# Patient Record
Sex: Female | Born: 1970 | Hispanic: No | Marital: Single | Smoking: Current every day smoker
Health system: Southern US, Community
[De-identification: ages and names within clinical notes are randomized; demographics above are authoritative.]

## PROBLEM LIST (undated history)

## (undated) DIAGNOSIS — R569 Unspecified convulsions: Secondary | ICD-10-CM

## (undated) HISTORY — PX: OTHER SURGICAL HISTORY: SHX169

---

## 2014-06-08 ENCOUNTER — Emergency Department (HOSPITAL_COMMUNITY): Payer: Medicaid - Out of State

## 2014-06-08 ENCOUNTER — Encounter (HOSPITAL_COMMUNITY): Payer: Self-pay | Admitting: General Practice

## 2014-06-08 ENCOUNTER — Inpatient Hospital Stay (HOSPITAL_COMMUNITY)
Admission: EM | Admit: 2014-06-08 | Discharge: 2014-06-15 | DRG: 100 | Disposition: A | Discharge status: Home / Self Care | Payer: Medicaid - Out of State | Attending: Internal Medicine | Admitting: Internal Medicine

## 2014-06-08 DIAGNOSIS — E876 Hypokalemia: Secondary | ICD-10-CM | POA: Insufficient documentation

## 2014-06-08 DIAGNOSIS — E872 Acidosis: Secondary | ICD-10-CM | POA: Diagnosis present

## 2014-06-08 DIAGNOSIS — B373 Candidiasis of vulva and vagina: Secondary | ICD-10-CM | POA: Diagnosis present

## 2014-06-08 DIAGNOSIS — J690 Pneumonitis due to inhalation of food and vomit: Secondary | ICD-10-CM | POA: Diagnosis not present

## 2014-06-08 DIAGNOSIS — E871 Hypo-osmolality and hyponatremia: Secondary | ICD-10-CM | POA: Diagnosis not present

## 2014-06-08 DIAGNOSIS — G809 Cerebral palsy, unspecified: Secondary | ICD-10-CM | POA: Diagnosis present

## 2014-06-08 DIAGNOSIS — J9601 Acute respiratory failure with hypoxia: Secondary | ICD-10-CM | POA: Diagnosis not present

## 2014-06-08 DIAGNOSIS — G40901 Epilepsy, unspecified, not intractable, with status epilepticus: Secondary | ICD-10-CM

## 2014-06-08 DIAGNOSIS — R4189 Other symptoms and signs involving cognitive functions and awareness: Secondary | ICD-10-CM | POA: Diagnosis present

## 2014-06-08 DIAGNOSIS — D72829 Elevated white blood cell count, unspecified: Secondary | ICD-10-CM

## 2014-06-08 DIAGNOSIS — G8194 Hemiplegia, unspecified affecting left nondominant side: Secondary | ICD-10-CM

## 2014-06-08 DIAGNOSIS — A419 Sepsis, unspecified organism: Secondary | ICD-10-CM | POA: Diagnosis not present

## 2014-06-08 DIAGNOSIS — G40309 Generalized idiopathic epilepsy and epileptic syndromes, not intractable, without status epilepticus: Secondary | ICD-10-CM | POA: Diagnosis present

## 2014-06-08 DIAGNOSIS — R410 Disorientation, unspecified: Secondary | ICD-10-CM

## 2014-06-08 DIAGNOSIS — G40311 Generalized idiopathic epilepsy and epileptic syndromes, intractable, with status epilepticus: Secondary | ICD-10-CM | POA: Diagnosis not present

## 2014-06-08 DIAGNOSIS — G8190 Hemiplegia, unspecified affecting unspecified side: Secondary | ICD-10-CM

## 2014-06-08 DIAGNOSIS — G40301 Generalized idiopathic epilepsy and epileptic syndromes, not intractable, with status epilepticus: Secondary | ICD-10-CM | POA: Diagnosis not present

## 2014-06-08 DIAGNOSIS — G40911 Epilepsy, unspecified, intractable, with status epilepticus: Principal | ICD-10-CM | POA: Diagnosis present

## 2014-06-08 DIAGNOSIS — Z79899 Other long term (current) drug therapy: Secondary | ICD-10-CM | POA: Diagnosis not present

## 2014-06-08 DIAGNOSIS — J189 Pneumonia, unspecified organism: Secondary | ICD-10-CM

## 2014-06-08 DIAGNOSIS — B3731 Acute candidiasis of vulva and vagina: Secondary | ICD-10-CM | POA: Insufficient documentation

## 2014-06-08 DIAGNOSIS — R64 Cachexia: Secondary | ICD-10-CM | POA: Diagnosis present

## 2014-06-08 DIAGNOSIS — T17908A Unspecified foreign body in respiratory tract, part unspecified causing other injury, initial encounter: Secondary | ICD-10-CM

## 2014-06-08 DIAGNOSIS — G9340 Encephalopathy, unspecified: Secondary | ICD-10-CM | POA: Insufficient documentation

## 2014-06-08 DIAGNOSIS — G40909 Epilepsy, unspecified, not intractable, without status epilepticus: Secondary | ICD-10-CM | POA: Insufficient documentation

## 2014-06-08 HISTORY — DX: Unspecified convulsions: R56.9

## 2014-06-08 LAB — COMPREHENSIVE METABOLIC PANEL
ALT: 19 U/L (ref 0–35)
AST: 31 U/L (ref 0–37)
Albumin: 4.2 g/dL (ref 3.5–5.2)
Alkaline Phosphatase: 83 U/L (ref 39–117)
Anion gap: 12 (ref 5–15)
BUN: 23 mg/dL (ref 6–23)
CALCIUM: 9.7 mg/dL (ref 8.4–10.5)
CHLORIDE: 106 mmol/L (ref 96–112)
CO2: 19 mmol/L (ref 19–32)
Creatinine, Ser: 1.01 mg/dL (ref 0.50–1.10)
GFR, EST AFRICAN AMERICAN: 78 mL/min — AB (ref >90)
GFR, EST NON AFRICAN AMERICAN: 68 mL/min — AB (ref >90)
Glucose, Bld: 101 mg/dL — ABNORMAL HIGH (ref 70–99)
Potassium: 4 mmol/L (ref 3.5–5.1)
Sodium: 137 mmol/L (ref 135–145)
Total Bilirubin: 1.2 mg/dL (ref 0.3–1.2)
Total Protein: 7.9 g/dL (ref 6.0–8.3)

## 2014-06-08 LAB — URINALYSIS, ROUTINE W REFLEX MICROSCOPIC
Bilirubin Urine: NEGATIVE
Glucose, UA: NEGATIVE mg/dL
Hgb urine dipstick: NEGATIVE
Ketones, ur: 40 mg/dL — AB
Leukocytes, UA: NEGATIVE
Nitrite: NEGATIVE
Protein, ur: NEGATIVE mg/dL
SPECIFIC GRAVITY, URINE: 1.027 (ref 1.005–1.030)
UROBILINOGEN UA: 0.2 mg/dL (ref 0.0–1.0)
pH: 5.5 (ref 5.0–8.0)

## 2014-06-08 LAB — CBC WITH DIFFERENTIAL/PLATELET
Basophils Absolute: 0 10*3/uL (ref 0.0–0.1)
Basophils Relative: 0 % (ref 0–1)
EOS ABS: 0 10*3/uL (ref 0.0–0.7)
EOS PCT: 0 % (ref 0–5)
HCT: 39 % (ref 36.0–46.0)
Hemoglobin: 13.1 g/dL (ref 12.0–15.0)
LYMPHS ABS: 1.3 10*3/uL (ref 0.7–4.0)
Lymphocytes Relative: 6 % — ABNORMAL LOW (ref 12–46)
MCH: 30 pg (ref 26.0–34.0)
MCHC: 33.6 g/dL (ref 30.0–36.0)
MCV: 89.2 fL (ref 78.0–100.0)
Monocytes Absolute: 0.7 10*3/uL (ref 0.1–1.0)
Monocytes Relative: 3 % (ref 3–12)
NEUTROS PCT: 91 % — AB (ref 43–77)
Neutro Abs: 20 10*3/uL — ABNORMAL HIGH (ref 1.7–7.7)
PLATELETS: 280 10*3/uL (ref 150–400)
RBC: 4.37 MIL/uL (ref 3.87–5.11)
RDW: 14.1 % (ref 11.5–15.5)
WBC: 22.1 10*3/uL — ABNORMAL HIGH (ref 4.0–10.5)

## 2014-06-08 LAB — I-STAT BETA HCG BLOOD, ED (MC, WL, AP ONLY): I-stat hCG, quantitative: <5 m[IU]/mL (ref <5)

## 2014-06-08 LAB — CBG MONITORING, ED: GLUCOSE-CAPILLARY: 91 mg/dL (ref 70–99)

## 2014-06-08 LAB — MRSA PCR SCREENING: MRSA by PCR: NEGATIVE

## 2014-06-08 LAB — I-STAT CG4 LACTIC ACID, ED: LACTIC ACID, VENOUS: 2.83 mmol/L — AB (ref 0.5–2.0)

## 2014-06-08 LAB — MAGNESIUM: Magnesium: 1.8 mg/dL (ref 1.5–2.5)

## 2014-06-08 LAB — GLUCOSE, CAPILLARY: Glucose-Capillary: 93 mg/dL (ref 70–99)

## 2014-06-08 MED ORDER — VALPROATE SODIUM 500 MG/5ML IV SOLN
500.0000 mg | Freq: Two times a day (BID) | INTRAVENOUS | Status: DC
  Administered 2014-06-08: 500 mg via INTRAVENOUS
  Filled 2014-06-08 (×2): qty 5

## 2014-06-08 MED ORDER — LEVETIRACETAM IN NACL 1000 MG/100ML IV SOLN
1000.0000 mg | INTRAVENOUS | Status: AC
  Administered 2014-06-08: 1000 mg via INTRAVENOUS
  Filled 2014-06-08: qty 100

## 2014-06-08 MED ORDER — SODIUM CHLORIDE 0.9 % IV BOLUS (SEPSIS)
1000.0000 mL | Freq: Once | INTRAVENOUS | Status: AC
  Administered 2014-06-08: 1000 mL via INTRAVENOUS

## 2014-06-08 MED ORDER — LORAZEPAM 2 MG/ML IJ SOLN
1.0000 mg | Freq: Once | INTRAMUSCULAR | Status: AC
  Administered 2014-06-08: 1 mg via INTRAVENOUS
  Filled 2014-06-08: qty 1

## 2014-06-08 MED ORDER — SODIUM CHLORIDE 0.9 % IV SOLN
200.0000 mg | INTRAVENOUS | Status: AC
  Administered 2014-06-08: 200 mg via INTRAVENOUS
  Filled 2014-06-08: qty 20

## 2014-06-08 MED ORDER — VALPROATE SODIUM 500 MG/5ML IV SOLN
1.0000 g | Freq: Once | INTRAVENOUS | Status: DC
  Filled 2014-06-08: qty 10

## 2014-06-08 MED ORDER — ALBUTEROL SULFATE (2.5 MG/3ML) 0.083% IN NEBU: 2.5000 mg | INHALATION_SOLUTION | RESPIRATORY_TRACT | Status: DC | PRN

## 2014-06-08 MED ORDER — ONDANSETRON HCL 4 MG/2ML IJ SOLN: 4.0000 mg | Freq: Four times a day (QID) | INTRAMUSCULAR | Status: DC | PRN

## 2014-06-08 MED ORDER — ENOXAPARIN SODIUM 40 MG/0.4ML ~~LOC~~ SOLN: 40.0000 mg | SUBCUTANEOUS | Status: DC

## 2014-06-08 MED ORDER — SODIUM CHLORIDE 0.9 % IV SOLN
INTRAVENOUS | Status: DC
  Administered 2014-06-08 – 2014-06-09 (×2): via INTRAVENOUS

## 2014-06-08 MED ORDER — LORAZEPAM 2 MG/ML IJ SOLN
1.0000 mg | INTRAMUSCULAR | Status: DC | PRN
Start: 2014-06-08 — End: 2014-06-15
  Administered 2014-06-08 – 2014-06-09 (×3): 1 mg via INTRAVENOUS
  Filled 2014-06-08 (×3): qty 1

## 2014-06-08 MED ORDER — SODIUM CHLORIDE 0.9 % IV SOLN
200.0000 mg | Freq: Two times a day (BID) | INTRAVENOUS | Status: DC
  Administered 2014-06-09 (×2): 200 mg via INTRAVENOUS
  Filled 2014-06-08 (×4): qty 20

## 2014-06-08 MED ORDER — ENOXAPARIN SODIUM 30 MG/0.3ML ~~LOC~~ SOLN
30.0000 mg | SUBCUTANEOUS | Status: DC
  Administered 2014-06-08 – 2014-06-14 (×7): 30 mg via SUBCUTANEOUS
  Filled 2014-06-08 (×7): qty 0.3

## 2014-06-08 MED ORDER — VALPROATE SODIUM 500 MG/5ML IV SOLN
500.0000 mg | Freq: Three times a day (TID) | INTRAVENOUS | Status: DC
  Administered 2014-06-08 – 2014-06-10 (×5): 500 mg via INTRAVENOUS
  Filled 2014-06-08 (×7): qty 5

## 2014-06-08 MED ORDER — SODIUM CHLORIDE 0.9 % IJ SOLN
3.0000 mL | Freq: Two times a day (BID) | INTRAMUSCULAR | Status: DC
  Administered 2014-06-08 – 2014-06-14 (×5): 3 mL via INTRAVENOUS

## 2014-06-08 MED ORDER — LEVETIRACETAM IN NACL 1500 MG/100ML IV SOLN
1500.0000 mg | Freq: Two times a day (BID) | INTRAVENOUS | Status: DC
  Administered 2014-06-08 – 2014-06-13 (×11): 1500 mg via INTRAVENOUS
  Filled 2014-06-08 (×15): qty 100

## 2014-06-08 MED ORDER — DEXTROSE 5 % IV SOLN
500.0000 mg | Freq: Two times a day (BID) | INTRAVENOUS | Status: DC
  Filled 2014-06-08: qty 5

## 2014-06-08 MED ORDER — ACETAMINOPHEN 650 MG RE SUPP
650.0000 mg | Freq: Four times a day (QID) | RECTAL | Status: DC | PRN
Start: 2014-06-08 — End: 2014-06-15
  Administered 2014-06-10 – 2014-06-11 (×4): 650 mg via RECTAL
  Filled 2014-06-08 (×4): qty 1

## 2014-06-08 NOTE — ED Notes (Signed)
Neurology at bedside and aware of recent seizure

## 2014-06-08 NOTE — ED Notes (Signed)
Pt.s familuy came out an reported that pt. Having a seizure X 1

## 2014-06-08 NOTE — Consult Note (Signed)
Neurology Consultation Reason for Consult: Seizures Referring Physician: Silverio LayYao, D  CC: Seizures  History is obtained from:Family with interoreter.  HPI: Rachel Bush is a 44 y.o. female with a history of seizures since birth as well as cerebral dysfunction which at this time is of unclear etiology to me. Family reports that she was able to talk in the past, but has not been able to in 12 - 13 years. She is non-ambulatory at baseline. Family reports that as a young child, she was able to walk.  She was phenobarbital in the past, but was seeing a neurologist in [place] and is currently on depakote and keppra. She typically will have approximately one seizure per day, but over the past 24 hours, she has been having frequent recurrent seizures.   Depakote level in the ER was 0   ROS:  Unable to obtain due to altered mental status.   Past Medical History  Diagnosis Date  . Seizures     Family History: No history of similar afflictions in family members  Social History: Lives with sister.   Exam: Current vital signs: BP 115/72 mmHg  Pulse 91  Temp(Src) 99.8 F (37.7 C) (Rectal)  Resp 17  Ht 4\' 11"  (1.499 m)  Wt 52.164 kg (115 lb)  BMI 23.21 kg/m2  SpO2 100%  LMP  (LMP Unknown) Vital signs in last 24 hours: Temp:  [99.8 F (37.7 C)] 99.8 F (37.7 C) (03/01 0949) Pulse Rate:  [82-138] 91 (03/01 1230) Resp:  [16-27] 17 (03/01 1230) BP: (107-115)/(67-78) 115/72 mmHg (03/01 1230) SpO2:  [97 %-100 %] 100 % (03/01 1230) Weight:  [52.164 kg (115 lb)] 52.164 kg (115 lb) (03/01 0949)   Physical Exam  Constitutional: Appears thin Psych: does not speak Eyes: No scleral injection HENT: No OP obstrucion Head: Normocephalic.  Cardiovascular: Normal rate and regular rhythm.  Respiratory: Effort normal  GI: Soft.  No distension. There is no tenderness.  Skin: WDI  Neuro: Mental Status: Patient is obtunded, doe snot respond to voice. Does open eyes some to noxious stimuli.   Cranial Nerves: II: does not blink to threat.  Pupils are equal, round, and reactive to light.   III,IV, VI: eyes are midline except during seizure when deviated to the left  V: responds to stim biolaterally.  VII: Facial appears grossly symmetric VIII, X, XI, XII: Unable to assess secondary to patient's altered mental status.  Motor: She has more movement on the right than left, but does withdraw in all 4 ext.  Sensory: As above.  Cerebellar: Unable to assess secondary to patient's altered mental status.      I have reviewed labs in epic and the results pertinent to this consultation are: Elevated lactate.   I have reviewed the images obtained:CT head - cerebellar atrophy(possibly dilantin exposure?) as well as old cortical calcifications in the left frontal region.   Impression: 44 yo F with longstanding epilepsy and breakthrough seizures in teh setting of subtheraputic depakote. Unclear why her depakote was low, as family claims she has not missed any doses.   Recommendations: 1) keppra 1500mg  BID 2) Depakote 500mg  TID 3) Vimpat 200mg  BID.  4) will continue to follow.    Ritta SlotMcNeill Kirkpatrick, MD [facility] [phone]  If 7pm- 7am, please page neurology on call as listed in AMION.

## 2014-06-08 NOTE — Progress Notes (Signed)
Pt had 2 seizures within , each lasting less than 30 secs. 1 mg of Ativan given.

## 2014-06-08 NOTE — ED Notes (Signed)
Pt's airway intact.

## 2014-06-08 NOTE — Procedures (Signed)
ELECTROENCEPHALOGRAM REPORT  Patient: Rachel Bush       Room #: ED-D33 EEG No. ID: [id number] Age: 44 y.o.        Sex: female Referring Physician: Hongalgi Report Date:  06/08/2014        Interpreting Physician: Aline BrochureSTEWART,[name]  History: Rachel Bush is an 44 y.o. female with a history of cerebral palsy with severe diffuse brain dysfunction as well as hemiparesthesias and seizure disorder, brought to the emergency room after experiencing multiple witnessed seizures overnight at home. He is continued to have intermittent seizures in the emergency room as well with head turning to the left side as well as predominant motor seizure activity involving left extremities.  Indications for study:  Rule out status epilepticus.  Technique: This is an 18 channel routine scalp EEG performed at the bedside with bipolar and monopolar montages arranged in accordance to the international 10/20 system of electrode placement.   Description: The predominant background cerebral activity consisted of diffuse symmetrical moderate amplitude mixed delta and theta activity. Frequent sharp wave discharges were recorded from the right cerebral hemisphere with phase reversals primarily in the frontal and parietal regions. Occasional generalized sharp wave discharges were recorded. Patient had one episode of clinical seizure activity during this recording. EEG activity during the seizure consisted of initially an increase in focal as well as generalized sharp wave discharges followed by fairly abrupt onset of diffuse rhythmic 12 Hz activity which rapidly became obscured almost obscured by motor activity. The rhythmic fast activity rapidly gave way to rhythmic diffuse slow-wave activity for several seconds, then to return to her baseline activity.  Interpretation: This EEG is abnormal with findings consistent with moderately severe diffuse encephalopaty, as well as seizure disorder with a generalized seizure recorded during  the study as well as what appears to be an epileptogenic focus involving the right hemisphere.   Venetia MaxonR Tyran Huser M.D. [facility] [phone]

## 2014-06-08 NOTE — ED Notes (Addendum)
Per pt family, pt is confused at baseline and is normally unable to communicate needs. Pt is able to walk a short distance at baseline.

## 2014-06-08 NOTE — ED Notes (Signed)
Reported to Dr. Al CorpusHongali  And Dr. Amada JupiterKirkpatrick that pt. Had 3 seizures and was given 1 mg Iv Ativan.   Orders received to given another 1 mg IV Ativan if pt. Has another seizure.

## 2014-06-08 NOTE — Progress Notes (Signed)
STAT EEG completed; results pending. 

## 2014-06-08 NOTE — ED Provider Notes (Signed)
CSN: [id number]     Arrival date & time 06/08/14  [id number] History   First MD Initiated Contact with Patient 06/08/14 0945     Chief Complaint  Patient presents with  . Seizures     (Consider location/radiation/quality/duration/timing/severity/associated sxs/prior Treatment) HPI   44 year old female with known past medical history of seizure disorder, with reported daily seizures, who presents with approximately 12 hour history of seizing. Per report from the family, although limited by language barrier, patient has been seizing all night with no return to baseline. EMS was subsequent called this morning. Per EMS, the patient had 3 witnessed generalized tonic-clonic seizures en route. She was given Versed 2.5 mg IV 3 with intermittent resolution of seizure activity. She has not been responsive for EMS, but has been satting well with mild tachycardia but otherwise stable vital signs. Due to language barrier remainder of history is limited.  Level 5 Exception: Patient non-verbal  No past medical history on file. No past surgical history on file. No family history on file. History  Substance Use Topics  . Smoking status: Not on file  . Smokeless tobacco: Not on file  . Alcohol Use: Not on file   OB History    No data available     Review of Systems  Unable to perform ROS: Patient nonverbal      Allergies  Review of patient's allergies indicates no known allergies.  Home Medications   Prior to Admission medications   Medication Sig Start Date End Date Taking? Authorizing Provider  divalproex (DEPAKOTE) 500 MG DR tablet Take 500 mg by mouth 2 (two) times daily.   Yes Historical Provider, MD  escitalopram (LEXAPRO) 10 MG tablet Take 15 mg by mouth daily.   Yes Historical Provider, MD  levETIRAcetam (KEPPRA) 500 MG tablet Take 1,500 mg by mouth 2 (two) times daily.   Yes Historical Provider, MD   BP 109/73 mmHg  Pulse 104  Temp(Src) 99.8 F (37.7 C) (Rectal)  Resp 22  Ht 4'  11" (1.499 m)  Wt 115 lb (52.164 kg)  BMI 23.21 kg/m2  SpO2 97% Physical Exam  Constitutional: She appears well-developed and well-nourished.  HENT:  Head: Normocephalic and atraumatic.  Mouth/Throat: Oropharynx is clear and moist. No oropharyngeal exudate.  No apparent dental or oral trauma. No pooling of secretions noted.  Eyes: Conjunctivae are normal. Pupils are equal, round, and reactive to light.  4 mm and equally reactive bilaterally. No nystagmus  Neck: Normal range of motion. Neck supple.  Cardiovascular: Normal heart sounds and intact distal pulses.  Tachycardia present.  Exam reveals no friction rub.   No murmur heard. Pulmonary/Chest: Effort normal and breath sounds normal. No respiratory distress. She has no wheezes. She has no rales.  Abdominal: Soft. Bowel sounds are normal. She exhibits no distension. There is no tenderness.  Musculoskeletal: She exhibits no edema.  Skin: Skin is warm. No rash noted.  Nursing note and vitals reviewed.   Neurological Exam:  - Mental Status: Post-ictal, non-responsive but protecting airway. Speech mumbled.  - Cranial Nerves: PERRLA. No nystagmus. No facial asymmetry. Tongue midline at rest. Remainder limited due to AMS. - Motor: MAE but unable to participate - Reflexes: 2+ and symmetrical in all four extremities.  - Sensation: Unable to assess - Gait: Deferred - Coordination: Unable to participate  ED Course  Procedures (including critical care time) Labs Review Labs Reviewed  COMPREHENSIVE METABOLIC PANEL - Abnormal; Notable for the following:    Glucose, Bld 101 (*)  GFR calc non Af Amer 68 (*)    GFR calc Af Amer 78 (*)    All other components within normal limits  CBC WITH DIFFERENTIAL/PLATELET - Abnormal; Notable for the following:    WBC 22.1 (*)    Neutrophils Relative % 91 (*)    Neutro Abs 20.0 (*)    Lymphocytes Relative 6 (*)    All other components within normal limits  VALPROIC ACID LEVEL - Abnormal; Notable  for the following:    Valproic Acid Lvl <10.0 (*)    All other components within normal limits  URINALYSIS, ROUTINE W REFLEX MICROSCOPIC - Abnormal; Notable for the following:    Ketones, ur 40 (*)    All other components within normal limits  I-STAT CG4 LACTIC ACID, ED - Abnormal; Notable for the following:    Lactic Acid, Venous 2.83 (*)    All other components within normal limits  MRSA PCR SCREENING  MAGNESIUM  LEVETIRACETAM LEVEL  CBG MONITORING, ED  I-STAT BETA HCG BLOOD, ED ([facility], [facility], [facility] ONLY)    Imaging Review Ct Head Wo Contrast  06/08/2014   CLINICAL DATA:  Seizures starting last night  EXAM: CT HEAD WITHOUT CONTRAST  TECHNIQUE: Contiguous axial images were obtained from the base of the skull through the vertex without intravenous contrast.  COMPARISON:  None.  FINDINGS: No skull fracture is noted. Mucosal thickening of right maxillary sinus. The mastoid air cells are unremarkable.  No intracranial hemorrhage, mass effect or midline shift. No acute cortical infarction. No mass lesion is noted on this unenhanced scan. The gray and white-matter differentiation is preserved.  IMPRESSION: No acute intracranial abnormality. Mucosal thickening right maxillary sinus   Electronically Signed   By: Natasha Mead M.D.   On: 06/08/2014 12:52   Dg Chest Port 1 View  06/08/2014   CLINICAL DATA:  Seizures and unresponsive  EXAM: PORTABLE CHEST - 1 VIEW  COMPARISON:  None.  FINDINGS: The heart size and mediastinal contours are within normal limits. Both lungs are clear. The visualized skeletal structures are unremarkable. Bilateral nipple shadows are noted over the bases.  IMPRESSION: No active disease.   Electronically Signed   By: Alcide Clever M.D.   On: 06/08/2014 11:51     EKG Interpretation None      MDM   Final diagnoses:  Confusion  Status epilepticus   44 yo F with PMHx of seizure disorder who presents with multiple, recurrent seizures over the last 12 hours, with 3 witnessed seizures  per EMS. See HPI above. ON arrival, T 99.66F, HR 104, RR 22, BP 109/73, satting 97% on RA. Exam as above, pt non-verbal, post-ictal, but protecting airway with no pooling of secretions.  Pt's presentation is most c/w acute status epilepticus, with multiple (>5) seizures in past 1-2 hours with no return to baseline. Pt post-ictal but protecting airway at this time. Will load with IV Keppra, send depakote level, and plan for repeat Ativan with likely intubation if seizure activity returns. Unable to obtain collateral from family at his time. Will also work-up for infectious, metabolic etiologies and send for CT Head as well.   CT head negative. Consulted Neurology, who will perform EEG in ED. No recurrent seizure activity at this time. Labs reviewed as above, remarkable for leukocytosis and mild lactic acidosis, likely 2/2 seizure activity with demargination.  Neuro has evaluated. Pt had recurrent seizure that broke spontaneously. Valproate level undetectable. Will load with valproic acid per Neuro and admit for status epilepticus.  Pt continues to protect airway. VSS and improving with IVF.  Clinical Impression: 1. Status epilepticus   2. Confusion     Disposition: Admit  Condition: Stable  Pt seen in conjunction with Dr. Hilarie Fredrickson, MD 06/08/14 1947  Richardean Canal, MD 06/09/14 [phone]

## 2014-06-08 NOTE — ED Notes (Signed)
Lactic acid results given to Browning, PA-C 

## 2014-06-08 NOTE — ED Notes (Signed)
Pt had another seizure witnessed by MD. Pt's family states she had a seizure at 1310 and 1320 as well.

## 2014-06-08 NOTE — Progress Notes (Signed)
Pt had seizure with eyes deviating to the left, lasting approximately 20 secs. Airway maintained, VSS returned to baseline. Will continue to monitor.

## 2014-06-08 NOTE — Progress Notes (Signed)
Pt's BP reading 80s/50s. Paged Hongalgi, MD, ordered to take BP manually and page back with result. Manual BP 92/60. Hongalgi, MD ordered to continue to monitor for now and recheck in one hour. No intervention at this time.

## 2014-06-08 NOTE — ED Notes (Signed)
Pt had a seizure lasting less than a minute. During seizure pt had shaking, left eye deviation, and pt   contracting left arm. Pt has had a total of 4 of these type seizures since ED admission per family. RN witnessed 2 of the 4 seizures.

## 2014-06-08 NOTE — ED Notes (Signed)
Pt brought in via EMS complaining of seizures since last night. EMS reports grand-mal seizures that favor the left side. EMS gave a total of 7.5mg  of Valium IV.EMS V/S 110/74, HR 120, 95% RA, CBG 90.

## 2014-06-08 NOTE — ED Notes (Signed)
Neurology MD and EEG tech at bedside

## 2014-06-08 NOTE — H&P (Signed)
History and Physical  Rachel Bush RUE:[id number] DOB: 1970/09/16 DOA: 06/08/2014  Referring physician: Dr. Silverio Lay PCP: No primary care provider on file.  Outpatient Specialists:  1. None  Chief Complaint: Intractable seizures.  HPI: Rachel Bush is a 44 y.o. femaleNepalese speaking family, with history of seizures since birth, severe cognitive impairment, nonverbal at baseline, fully dependent on ADLs, left hemiparesis, [facility]14 with frequent seizures. History was obtained partially by discussing with family (daughter-in-law who is able to communicate some in [place]) and from the neuro hospitalist who had obtained history using a interpreter. Patient and family recently moved from [place] to the [place] area a month ago. They claim compliance with antiepileptics. Since last night, patient has had multiple episodes of seizures almost every 5 minutes. However family indicates that they did not seek immediate medical attention because they were new to the area and did not have transportation. Since seizures continued this morning, patient was eventually brought to [facility] ED. Neuro hospitalist has evaluated and patient was given additional dose of IV Keppra, IV Depakote and Vimpat was started. She has received several doses of Ativan. Seizures have finally reduced to approximately every hour. As per neurologist, she has clinical seizures without status epilepticus by EEG monitoring. The neuro hospitalist has confirmed full CODE STATUS. At baseline, patient is nonverbal, nonambulatory, left hemiparetic, cannot feed self and is total care. In the ED, lab work significant for elevated lactate 2.83, WBC 22.1, CT head negative for acute findings, chest x-ray negative for acute findings and urine microscopy not indicative of UTI. Hospitalist admission requested.   Review of Systems: All systems reviewed and apart from history of presenting illness, are negative.  Past Medical  History  Diagnosis Date  . Seizures    History reviewed. No pertinent past surgical history. Social History:  reports that she has never smoked. She does not have any smokeless tobacco history on file. Her alcohol and drug histories are not on file. Patient lives with extended family including parents, sister, and other family members. She is total care.  No Known Allergies  No family history on file. no other significant family history.  Prior to Admission medications   Medication Sig Start Date End Date Taking? Authorizing Provider  divalproex (DEPAKOTE) 500 MG DR tablet Take 500 mg by mouth 2 (two) times daily.   Yes Historical Provider, MD  escitalopram (LEXAPRO) 10 MG tablet Take 15 mg by mouth daily.   Yes Historical Provider, MD  levETIRAcetam (KEPPRA) 500 MG tablet Take 1,500 mg by mouth 2 (two) times daily.   Yes Historical Provider, MD   Physical Exam: Filed Vitals:   06/08/14 1200 06/08/14 1230 06/08/14 1300 06/08/14 1330  BP: 108/67 115/72 108/69 103/61  Pulse: 96 91 93 105  Temp:      TempSrc:      Resp: Height:      Weight:      SpO2: 99% 100% 100% 100%   temperature 99.71F.   General exam: Small built and thinly nourished young female, lying comfortably supine on the gurney in no obvious distress.  Head, eyes and ENT: Nontraumatic and normocephalic. Pupils equally reacting to light and accommodation. Oral mucosa dry.  Neck: Supple. No JVD, carotid bruit or thyromegaly.  Lymphatics: No lymphadenopathy.  Respiratory system: Reduced breath sounds bilaterally/poor inspiratory effort but seems clear to auscultation. No increased work of breathing.  Cardiovascular system: S1 and S2 heard, RRR. No JVD,  murmurs, gallops, clicks or pedal edema.  Gastrointestinal system: Abdomen is nondistended, soft and nontender. Normal bowel sounds heard. No organomegaly or masses appreciated.  Central nervous system: Drowsy, to noxious stimulus will briefly open  eyes but no verbal response. Facial asymmetry +. No other focal neurological deficits noted.  Extremities: Peripheral pulses symmetrically felt. Currently not moving limbs.  Skin: No rashes or acute findings.  Musculoskeletal system: Negative exam.  Psychiatry: Unable to assess secondary to mental status change.   Labs on Admission:  Basic Metabolic Panel:  Recent Labs Lab 06/08/14 1002  NA 137  K 4.0  CL 106  CO2 19  GLUCOSE 101*  BUN 23  CREATININE 1.01  CALCIUM 9.7  MG 1.8   Liver Function Tests:  Recent Labs Lab 06/08/14 1002  AST 31  ALT 19  ALKPHOS 83  BILITOT 1.2  PROT 7.9  ALBUMIN 4.2   No results for input(s): LIPASE, AMYLASE in the last 168 hours. No results for input(s): AMMONIA in the last 168 hours. CBC:  Recent Labs Lab 06/08/14 1002  WBC 22.1*  NEUTROABS 20.0*  HGB 13.1  HCT 39.0  MCV 89.2  PLT 280   Cardiac Enzymes: No results for input(s): CKTOTAL, CKMB, CKMBINDEX, TROPONINI in the last 168 hours.  BNP (last 3 results) No results for input(s): PROBNP in the last 8760 hours. CBG:  Recent Labs Lab 06/08/14 1009  GLUCAP 91    Radiological Exams on Admission: Ct Head Wo Contrast  06/08/2014   CLINICAL DATA:  Seizures starting last night  EXAM: CT HEAD WITHOUT CONTRAST  TECHNIQUE: Contiguous axial images were obtained from the base of the skull through the vertex without intravenous contrast.  COMPARISON:  None.  FINDINGS: No skull fracture is noted. Mucosal thickening of right maxillary sinus. The mastoid air cells are unremarkable.  No intracranial hemorrhage, mass effect or midline shift. No acute cortical infarction. No mass lesion is noted on this unenhanced scan. The gray and white-matter differentiation is preserved.  IMPRESSION: No acute intracranial abnormality. Mucosal thickening right maxillary sinus   Electronically Signed   By: Natasha MeadLiviu  Pop M.D.   On: 06/08/2014 12:52   Dg Chest Port 1 View  06/08/2014   CLINICAL DATA:   Seizures and unresponsive  EXAM: PORTABLE CHEST - 1 VIEW  COMPARISON:  None.  FINDINGS: The heart size and mediastinal contours are within normal limits. Both lungs are clear. The visualized skeletal structures are unremarkable. Bilateral nipple shadows are noted over the bases.  IMPRESSION: No active disease.   Electronically Signed   By: Alcide CleverMark  Lukens M.D.   On: 06/08/2014 11:51    EKG: None in Epic for today.  Assessment/Plan Principal Problem:   Status epilepticus Active Problems:   Cognitive impairment   Left hemiparesis   1. Seizure disorder with breakthrough intractable seizures: Admit to stepdown unit. CT head negative. Family claims compliance with medications. No obvious source of sepsis. Precipitant unclear. Neurology has consulted and patient has received a dose of IV Keppra, Depakote and IV Vimpat added. Currently protecting airway. Seizure frequency has significantly decreased. Continue IV Keppra, IV Depakote and IV Vimpat staggered (not at same time) and when necessary IV Ativan. NPO. 2. Acute encephalopathy: Secondary to problem #1 complicating underlying severe cognitive impairment. Monitor closely with above treatment 3. Severe cognitive impairment and left hemiparesis: 4. Leukocytosis: Most likely secondary to stress response from intractable seizures. Follow CBCs. 5. Elevated lactate: Secondary to seizures. IV fluids.     Code Status: Full-confirmed  by Neuro hospitalist with family.  Family Communication: Discussed with patient's sister and sister-in-law at bedside.  Disposition Plan: Admit to stepdown unit. Home when medically stable.   Time spent: 70 minutes  HONGALGI,ANAND, MD, FACP, FHM. [facility] Pager [phone]  If 7PM-7AM, please contact night-coverage [email or web address] Password [email or web address] 06/08/2014, 2:01 PM

## 2014-06-09 ENCOUNTER — Inpatient Hospital Stay (HOSPITAL_COMMUNITY): Payer: Medicaid - Out of State

## 2014-06-09 LAB — BASIC METABOLIC PANEL
ANION GAP: 6 (ref 5–15)
BUN: 11 mg/dL (ref 6–23)
CO2: 22 mmol/L (ref 19–32)
CREATININE: 0.54 mg/dL (ref 0.50–1.10)
Calcium: 7.9 mg/dL — ABNORMAL LOW (ref 8.4–10.5)
Chloride: 108 mmol/L (ref 96–112)
GFR calc Af Amer: >90 mL/min (ref >90)
GFR calc non Af Amer: >90 mL/min (ref >90)
Glucose, Bld: 87 mg/dL (ref 70–99)
Potassium: 3.3 mmol/L — ABNORMAL LOW (ref 3.5–5.1)
SODIUM: 136 mmol/L (ref 135–145)

## 2014-06-09 LAB — BLOOD GAS, ARTERIAL
Acid-base deficit: 0.8 mmol/L (ref 0.0–2.0)
Bicarbonate: 22.7 mEq/L (ref 20.0–24.0)
DRAWN BY: 281201
O2 Content: 3 L/min
O2 SAT: 80.2 %
PATIENT TEMPERATURE: 98.6
PO2 ART: 45.8 mmHg — AB (ref 80.0–100.0)
TCO2: 23.7 mmol/L (ref 0–100)
pCO2 arterial: 33.5 mmHg — ABNORMAL LOW (ref 35.0–45.0)
pH, Arterial: 7.445 (ref 7.350–7.450)

## 2014-06-09 LAB — AMMONIA: AMMONIA: 31 umol/L (ref 11–32)

## 2014-06-09 LAB — CBC
HEMATOCRIT: 34.9 % — AB (ref 36.0–46.0)
Hemoglobin: 11.5 g/dL — ABNORMAL LOW (ref 12.0–15.0)
MCH: 29.9 pg (ref 26.0–34.0)
MCHC: 33 g/dL (ref 30.0–36.0)
MCV: 90.6 fL (ref 78.0–100.0)
Platelets: 233 10*3/uL (ref 150–400)
RBC: 3.85 MIL/uL — AB (ref 3.87–5.11)
RDW: 14.2 % (ref 11.5–15.5)
WBC: 20.8 10*3/uL — AB (ref 4.0–10.5)

## 2014-06-09 LAB — LACTIC ACID, PLASMA
LACTIC ACID, VENOUS: 1.8 mmol/L (ref 0.5–2.0)
Lactic Acid, Venous: 1.8 mmol/L (ref 0.5–2.0)

## 2014-06-09 LAB — VALPROIC ACID LEVEL: Valproic Acid Lvl: 91 ug/mL (ref 50.0–100.0)

## 2014-06-09 MED ORDER — INFLUENZA VAC SPLIT QUAD 0.5 ML IM SUSY
0.5000 mL | PREFILLED_SYRINGE | INTRAMUSCULAR | Status: AC
  Administered 2014-06-11: 0.5 mL via INTRAMUSCULAR
  Filled 2014-06-09: qty 0.5

## 2014-06-09 MED ORDER — SODIUM CHLORIDE 0.9 % IV SOLN
100.0000 mg | Freq: Two times a day (BID) | INTRAVENOUS | Status: DC
  Administered 2014-06-09 – 2014-06-13 (×7): 100 mg via INTRAVENOUS
  Filled 2014-06-09 (×10): qty 10

## 2014-06-09 MED ORDER — PIPERACILLIN-TAZOBACTAM 3.375 G IVPB
3.3750 g | Freq: Three times a day (TID) | INTRAVENOUS | Status: DC
  Filled 2014-06-09 (×3): qty 50

## 2014-06-09 MED ORDER — SODIUM CHLORIDE 0.9 % IV BOLUS (SEPSIS)
500.0000 mL | Freq: Once | INTRAVENOUS | Status: AC
  Administered 2014-06-09: 500 mL via INTRAVENOUS

## 2014-06-09 MED ORDER — SODIUM CHLORIDE 0.9 % IV SOLN
INTRAVENOUS | Status: DC
  Filled 2014-06-09 (×2): qty 1000

## 2014-06-09 MED ORDER — POTASSIUM CHLORIDE 2 MEQ/ML IV SOLN
INTRAVENOUS | Status: DC
  Administered 2014-06-10: 05:00:00 via INTRAVENOUS
  Filled 2014-06-09 (×4): qty 1000

## 2014-06-09 MED ORDER — POTASSIUM CHLORIDE 10 MEQ/100ML IV SOLN
10.0000 meq | INTRAVENOUS | Status: AC
  Administered 2014-06-09 (×3): 10 meq via INTRAVENOUS
  Filled 2014-06-09: qty 100

## 2014-06-09 MED ORDER — FLUCONAZOLE IN SODIUM CHLORIDE 200-0.9 MG/100ML-% IV SOLN
150.0000 mg | INTRAVENOUS | Status: DC
  Administered 2014-06-09: 150 mg via INTRAVENOUS
  Filled 2014-06-09: qty 75

## 2014-06-09 MED ORDER — HYDRALAZINE HCL 20 MG/ML IJ SOLN: 5.0000 mg | INTRAMUSCULAR | Status: DC | PRN

## 2014-06-09 MED ORDER — VANCOMYCIN HCL IN DEXTROSE 1-5 GM/200ML-% IV SOLN
1000.0000 mg | INTRAVENOUS | Status: DC
Start: 2014-06-09 — End: 2014-06-09
  Administered 2014-06-09: 1000 mg via INTRAVENOUS
  Filled 2014-06-09: qty 200

## 2014-06-09 MED ORDER — PIPERACILLIN-TAZOBACTAM 3.375 G IVPB
3.3750 g | Freq: Three times a day (TID) | INTRAVENOUS | Status: DC
  Administered 2014-06-09 – 2014-06-11 (×6): 3.375 g via INTRAVENOUS
  Filled 2014-06-09 (×8): qty 50

## 2014-06-09 NOTE — Progress Notes (Signed)
6214fr foley placed per MD order d/t acute urinary retention, no urine since start of shift at 1900. Pt tolerated procedure well Melissa/RN present as second person during insertion. Peri care care provided prior to insertion and sterile procedure maintained throughout. Dark amber urine returned. Will continue to monitor.

## 2014-06-09 NOTE — Progress Notes (Signed)
INITIAL NUTRITION ASSESSMENT  DOCUMENTATION CODES Per approved criteria  -Not Applicable   INTERVENTION:  Diet advancement per SLP as mental status improves.  RD to add PO supplements as needed once diet is advanced.  NUTRITION DIAGNOSIS: Inadequate oral intake related to inability to eat as evidenced by NPO status.   Goal: Intake to meet >90% of estimated nutrition needs.  Monitor:  Diet advancement, PO intake, labs, weight trend.  Reason for Assessment: Low Braden  44 y.o. female  Admitting Dx: Status epilepticus  ASSESSMENT: Patient presented to the ED on 3/1 with frequent seizures. History of seizures since birth, severe cognitive impairment, nonverbal at baseline, left hemiparesis.   Per discussion with RN, patient is not alert enough to take PO's at this time. SLP unable to complete swallow evaluation today. Per review of chart, patient is fully dependent for ADLs at home. Unable to complete nutrition focused physical exam at this time.  Height: Ht Readings from Last 1 Encounters:  06/08/14 4\' 10"  (1.473 m)    Weight: Wt Readings from Last 1 Encounters:  06/08/14 93 lb 0.6 oz (42.2 kg)    Ideal Body Weight: 43.9 kg  % Ideal Body Weight: 96%  Wt Readings from Last 10 Encounters:  06/08/14 93 lb 0.6 oz (42.2 kg)    Usual Body Weight: unknown  % Usual Body Weight: N/A  BMI:  Body mass index is 19.45 kg/(m^2).  Estimated Nutritional Needs: Kcal: 1200-1400 Protein: 60-70 gm Fluid: 1.5 L  Skin: no issues  Diet Order: Diet NPO time specified  EDUCATION NEEDS: -Education not appropriate at this time   Intake/Output Summary (Last 24 hours) at 06/09/14 1201 Last data filed at 06/09/14 0700  Gross per 24 hour  Intake      0 ml  Output   1250 ml  Net  -1250 ml    Last BM: unknown   Labs:   Recent Labs Lab 06/08/14 1002 06/09/14 0314  NA 137 136  K 4.0 3.3*  CL 106 108  CO2 19 22  BUN 23 11  CREATININE 1.01 0.54  CALCIUM 9.7 7.9*   MG 1.8  --   GLUCOSE 101* 87    CBG (last 3)   Recent Labs  06/08/14 1009 06/08/14 1605  GLUCAP 91 93    Scheduled Meds: . enoxaparin (LOVENOX) injection  30 mg Subcutaneous Q24H  . fluconazole (DIFLUCAN) IV  150 mg Intravenous Q72H  . [START ON 06/10/2014] Influenza vac split quadrivalent PF  0.5 mL Intramuscular Tomorrow-1000  . lacosamide (VIMPAT) IV  200 mg Intravenous Q12H  . levETIRAcetam  1,500 mg Intravenous Q12H  . piperacillin-tazobactam (ZOSYN)  IV  3.375 g Intravenous Q8H  . sodium chloride  500 mL Intravenous Once  . sodium chloride  3 mL Intravenous Q12H  . valproate sodium  1 g Intravenous Once  . valproate sodium  500 mg Intravenous 3 times per day  . vancomycin  1,000 mg Intravenous Q24H    Continuous Infusions: . dextrose 5 % and 0.9% NaCl 1,000 mL with potassium chloride 10 mEq infusion      Past Medical History  Diagnosis Date  . Seizures     History reviewed. No pertinent past surgical history.  Joaquin CourtsKimberly Harris, RD, LDN, CNSC Pager [phone] After Hours Pager [phone]

## 2014-06-09 NOTE — Progress Notes (Signed)
TEAM 1 - Stepdown/ICU TEAM Progress Note  Rachel GaskinsDamanta Bush ZOX:[id number] DOB: 10/13/1970 DOA: 06/08/2014 PCP: No primary care provider on file.  Admit HPI / Brief Narrative: 44 year old [place] female with history of severe cognitive impairment (nonverbal, nonambulatory at baseline - fully dependent on ADLs), seizure disorder, and left hemiparesis who [facility].  History was obtained from the daughter-in-law who is able to communicate in [place], as well as via an interpreter. Starting the night prior to her admit the patient had multiple seizure episodes occurring every 5 minutes. Neurology was consulted, patient was given an additional dose of IV Keppra, IV Depakote and then Vimpat was started. According to Neurologist patient has clinical seizures without status epilepticus by EEG monitoring.   In ED CT head was negative for acute findings, lactic acid was elevated at  2.83, WBCs 22.1, and CXR and UA with no abnormalities.   3/2- pt with evidence of aspiration pneumonia, placed on IV Vanc/Zosyn.   HPI/Subjective: Fully obtunded.  Will open eyes to noxious stimulus, non verbal. Nephew at bedside, states has had multiple seizures overnight with all extremities jerking.  Assessment/Plan:  Sepsis secondary to L lung Aspiration pneumonia/pneumonitis  -pt with WBCs 20.8, RR 30, dense L lung infiltrates noted on CXR -emperic Zosyn for now - follow clinically   -Lactic acid normal at 1.8 -500 NS bolus X1 - continue D5 NS at 1525ml/hr -blood, sputum cultures pending   Acute hypoxic respiratory failure due to L lung aspiration pneumonitis/PNA -3/2- pt with inc work of breathing on [place] O2 > placed on Ventimask -ABG 7.4/33/45 -CXR notes marked new infiltrate compared w/ admit CXR across L lung fields, worse in LLL   Seizure Disorder -Per nephew pt experienced multiple seizurse overnight with bilat upper and lower extremity jerking -EEG + for clinical  seizure -Per Neurology- placed on Depakote Keppra, Vimpat, and Ativan PRN. Source for breakthrough seizure possibly yeast infection  Yeast Vaginitis -Nurse reports candidal vaginitis -Diflucan started via IV as unable to take orals presently   Acute encephalopathy -only responds to noxious stimulus - nephew states that is her baseline -CT head with no acute findings  Mild Hypokalemia -replace and follow - Mg ok  Severe cognitive impairment and left hemiparesis -as per her baseline   Code Status: FULL Family Communication: Nephew at bedside Disposition Plan: SDU   Consultants: Neurology  Procedures: EEG 03/01: abnormal with findings consistent with moderately severe diffuse encephalopaty, as well as seizure disorder with a generalized seizure recorded during the study as well as what appears to be an epileptogenic focus involving the right hemisphere.  Antibiotics: Diflucan 3/2 >  DVT prophylaxis: SQ Lovenox  Objective: Blood pressure 104/62, pulse 108, temperature 100 F (37.8 C), temperature source Axillary, resp. rate 33, height 4\' 10"  (1.473 m), weight 42.2 kg (93 lb 0.6 oz), SpO2 97 %.  Intake/Output Summary (Last 24 hours) at 06/09/14 1116 Last data filed at 06/09/14 0700  Gross per 24 hour  Intake      0 ml  Output   1250 ml  Net  -1250 ml   Exam: General: cachectic female, obtunded - on face mask at this time  Lungs: air movement noted equally th/o L and R lung fields - crackles th/o L - no wheeze  Cardiovascular: Regular rate and rhythm without murmur gallop or rub normal S1 and S2 Abdomen: Nontender, nondistended, soft, bowel sounds positive, no rebound, no ascites, no appreciable mass Extremities: No significant cyanosis, clubbing, or  edema bilateral lower extremities  Data Reviewed: Basic Metabolic Panel:  Recent Labs Lab 06/08/14 1002 06/09/14 0314  NA 137 136  K 4.0 3.3*  CL 106 108  CO2 19 22  GLUCOSE 101* 87  BUN 23 11  CREATININE 1.01  0.54  CALCIUM 9.7 7.9*  MG 1.8  --    Liver Function Tests:  Recent Labs Lab 06/08/14 1002  AST 31  ALT 19  ALKPHOS 83  BILITOT 1.2  PROT 7.9  ALBUMIN 4.2   CBC:  Recent Labs Lab 06/08/14 1002 06/09/14 0314  WBC 22.1* 20.8*  NEUTROABS 20.0*  --   HGB 13.1 11.5*  HCT 39.0 34.9*  MCV 89.2 90.6  PLT 280 233    CBG:  Recent Labs Lab 06/08/14 1009 06/08/14 1605  GLUCAP 91 93    Recent Results (from the past 240 hour(s))  MRSA PCR Screening     Status: None   Collection Time: 06/08/14  4:11 PM  Result Value Ref Range Status   MRSA by PCR NEGATIVE NEGATIVE Final    Comment:        The GeneXpert MRSA Assay (FDA approved for NASAL specimens only), is one component of a comprehensive MRSA colonization surveillance program. It is not intended to diagnose MRSA infection nor to guide or monitor treatment for MRSA infections.      Studies:  Recent x-ray studies have been reviewed in detail by the Attending Physician  Scheduled Meds:  Scheduled Meds: . enoxaparin (LOVENOX) injection  30 mg Subcutaneous Q24H  . fluconazole (DIFLUCAN) IV  150 mg Intravenous Q72H  . [START ON 06/10/2014] Influenza vac split quadrivalent PF  0.5 mL Intramuscular Tomorrow-1000  . lacosamide (VIMPAT) IV  200 mg Intravenous Q12H  . levETIRAcetam  1,500 mg Intravenous Q12H  . piperacillin-tazobactam (ZOSYN)  IV  3.375 g Intravenous Q8H  . sodium chloride  3 mL Intravenous Q12H  . valproate sodium  1 g Intravenous Once  . valproate sodium  500 mg Intravenous 3 times per day  . vancomycin  1,000 mg Intravenous Q24H    Time spent on care of this patient: 35 mins   Illa Level , [facility]  [facility] Office  [phone] Pager - [phone]  On-Call/Text Page:      [email or web address]      password [email or web address]  If 7PM-7AM, please contact night-coverage [email or web address] Password [email or web address] 06/09/2014, 11:16 AM   LOS: 1 day   I have personally examined this patient and reviewed the entire  database. I have reviewed the above note, made any necessary editorial changes, and agree with its content.  Lonia Blood, MD [facility]

## 2014-06-09 NOTE — Progress Notes (Signed)
Subjective:  patient obtunded only responds to pain by moaning.  Son in bedroom states she had multiple seizures last nigh.  Nurse states "nothing major but a few seizures".   Objective: Current vital signs: BP 101/59 mmHg  Pulse 94  Temp(Src) 98.6 F (37 C) (Axillary)  Resp 21  Ht  (1.473 m)  Wt 42.2 kg (93 lb 0.6 oz)  BMI 19.45 kg/m2  SpO2 100%  LMP  (LMP Unknown) Vital signs in last 24 hours: Temp:  [97.1 F (36.2 C)-99.8 F (37.7 C)] 98.6 F (37 C) (03/02 0700) Pulse Rate:  [80-138] 94 (03/02 0345) Resp:  [16-37] 21 (03/02 0345) BP: (85-115)/(51-78) 101/59 mmHg (03/02 0345) SpO2:  [94 %-100 %] 100 % (03/02 0345) Weight:  [42.2 kg (93 lb 0.6 oz)-52.164 kg (115 lb)] 42.2 kg (93 lb 0.6 oz) (03/01 1608)  Intake/Output from previous day: 03/01 0701 - 03/02 0700 In: -  Out: 1250 [Urine:1250] Intake/Output this shift:   Nutritional status: Diet NPO time specified  Neurologic Exam: Mental Status: Patient is obtunded, does not respond to voice. Does not open eyes  to noxious stimuli.  Cranial Nerves: II: does not blink to threat. Pupils are equal, round, and reactive to light.  III,IV, VI: eyes are midline  V: responds to stim bilaterally.  VII: Facial appears grossly symmetric VIII, X, XI, XII: Unable to assess secondary to patient's altered mental status.  Motor: No movement noted in any extremity but no increased tone noted on PROM.  Sensory: As above.  Cerebellar: Unable to assess secondary to patient's altered mental status  Lab Results: Basic Metabolic Panel:  Recent Labs Lab 06/08/14 1002 06/09/14 0314  NA 137 136  K 4.0 3.3*  CL 106 108  CO2 19 22  GLUCOSE 101* 87  BUN 23 11  CREATININE 1.01 0.54  CALCIUM 9.7 7.9*  MG 1.8  --     Liver Function Tests:  Recent Labs Lab 06/08/14 1002  AST 31  ALT 19  ALKPHOS 83  BILITOT 1.2  PROT 7.9  ALBUMIN 4.2   No results for input(s): LIPASE, AMYLASE in the last 168 hours. No results  for input(s): AMMONIA in the last 168 hours.  CBC:  Recent Labs Lab 06/08/14 1002 06/09/14 0314  WBC 22.1* 20.8*  NEUTROABS 20.0*  --   HGB 13.1 11.5*  HCT 39.0 34.9*  MCV 89.2 90.6  PLT 280 233    Cardiac Enzymes: No results for input(s): CKTOTAL, CKMB, CKMBINDEX, TROPONINI in the last 168 hours.  Lipid Panel: No results for input(s): CHOL, TRIG, HDL, CHOLHDL, VLDL, LDLCALC in the last 168 hours.  CBG:  Recent Labs Lab 06/08/14 1009 06/08/14 1605  GLUCAP 91 93    Microbiology: Results for orders placed or performed during the hospital encounter of 06/08/14  MRSA PCR Screening     Status: None   Collection Time: 06/08/14  4:11 PM  Result Value Ref Range Status   MRSA by PCR NEGATIVE NEGATIVE Final    Comment:        The GeneXpert MRSA Assay (FDA approved for NASAL specimens only), is one component of a comprehensive MRSA colonization surveillance program. It is not intended to diagnose MRSA infection nor to guide or monitor treatment for MRSA infections.     Coagulation Studies: No results for input(s): LABPROT, INR in the last 72 hours.  Imaging: Ct Head Wo Contrast  06/08/2014   CLINICAL DATA:  Seizures starting last night  EXAM: CT HEAD WITHOUT CONTRAST  TECHNIQUE: Contiguous axial images were obtained from the base of the skull through the vertex without intravenous contrast.  COMPARISON:  None.  FINDINGS: No skull fracture is noted. Mucosal thickening of right maxillary sinus. The mastoid air cells are unremarkable.  No intracranial hemorrhage, mass effect or midline shift. No acute cortical infarction. No mass lesion is noted on this unenhanced scan. The gray and white-matter differentiation is preserved.  IMPRESSION: No acute intracranial abnormality. Mucosal thickening right maxillary sinus   Electronically Signed   By: Natasha MeadLiviu  Pop M.D.   On: 06/08/2014 12:52   Dg Chest Port 1 View  06/08/2014   CLINICAL DATA:  Seizures and unresponsive  EXAM: PORTABLE  CHEST - 1 VIEW  COMPARISON:  None.  FINDINGS: The heart size and mediastinal contours are within normal limits. Both lungs are clear. The visualized skeletal structures are unremarkable. Bilateral nipple shadows are noted over the bases.  IMPRESSION: No active disease.   Electronically Signed   By: Alcide CleverMark  Lukens M.D.   On: 06/08/2014 11:51    Medications:  Scheduled: . enoxaparin (LOVENOX) injection  30 mg Subcutaneous Q24H  . fluconazole (DIFLUCAN) IV  150 mg Intravenous Q72H  . [START ON 06/10/2014] Influenza vac split quadrivalent PF  0.5 mL Intramuscular Tomorrow-1000  . lacosamide (VIMPAT) IV  200 mg Intravenous Q12H  . levETIRAcetam  1,500 mg Intravenous Q12H  . sodium chloride  3 mL Intravenous Q12H  . valproate sodium  1 g Intravenous Once  . valproate sodium  500 mg Intravenous 3 times per day    Assessment/Plan:  44 yo F with longstanding epilepsy and breakthrough seizures in teh setting of subtheraputic Depakote and possible yeast infection (culture pending).  EEG was positive for episode of clinical seizure. Currently on Depakote 500 mg BID (wil level of 91), Keppra 1500 mg BID, Vimpat 200 mg BID.   Rachel MornDavid Smith PA-C [facility] [phone]  06/09/2014, 8:50 AM   Nursing reports candidal vaginitis. I suspect that this could be a source of breakthrough seizures and have started diflucan.   1) continues AEDs as above 2) diflucan 150mg  BID.   Ritta SlotMcNeill Shavon Ashmore, MD [facility] [phone]  If 7pm- 7am, please page neurology on call as listed in AMION.

## 2014-06-09 NOTE — Progress Notes (Signed)
SLP Cancellation Note  Patient Details Name: Rachel Bush MRN: [id number] DOB: 02/10/1971   Cancelled treatment:       Reason Eval/Treat Not Completed: Patient not medically ready;Fatigue/lethargy limiting ability to participate;Patient's level of consciousness. Per RN, pt is lethargic, hypoxic requiring ventimask. RN recommends deferring evaluation at this time. Will continue efforts.   Rachel Bush, [facility] CenterMSP, CCC-SLP [phone]  Rachel Bush, Rachel Bush 06/09/2014, 12:19 PM

## 2014-06-09 NOTE — Progress Notes (Signed)
ANTIBIOTIC CONSULT NOTE - INITIAL  Pharmacy Consult for Vancomycin, Zosyn Indication: aspiration pneumonia  No Known Allergies  Labs:  Recent Labs  06/08/14 1002 06/09/14 0314  WBC 22.1* 20.8*  HGB 13.1 11.5*  PLT 280 233  CREATININE 1.01 0.54   Estimated Creatinine Clearance: 59.1 mL/min (by C-G formula based on Cr of 0.54). No results for input(s): VANCOTROUGH, VANCOPEAK, VANCORANDOM, GENTTROUGH, GENTPEAK, GENTRANDOM, TOBRATROUGH, TOBRAPEAK, TOBRARND, AMIKACINPEAK, AMIKACINTROU, AMIKACIN in the last 72 hours.   Microbiology: Recent Results (from the past 720 hour(s))  MRSA PCR Screening     Status: None   Collection Time: 06/08/14  4:11 PM  Result Value Ref Range Status   MRSA by PCR NEGATIVE NEGATIVE Final    Comment:        The GeneXpert MRSA Assay (FDA approved for NASAL specimens only), is one component of a comprehensive MRSA colonization surveillance program. It is not intended to diagnose MRSA infection nor to guide or monitor treatment for MRSA infections.     Medical History: Past Medical History  Diagnosis Date  . Seizures     Assessment: 44 y.o. femaleNepalese speaking family, with history of seizures since birth, severe cognitive impairment, nonverbal at baseline, fully dependent on ADLs, left hemiparesis, [facility] with breakthrough seizures in the setting of sub-therapeutic Depakote level and possible infection.  Pharmacy asked to dose Vancomycin and Zosyn  Goal of Therapy:  Vancomycin trough level 15-20 mcg/ml  Appropriate Zosyn dosing  Plan:  Zosyn 3.375 grams iv Q 8 hours - 4 hr infusion Vancomycin 1 gram iv Q 24 hours Follow up progress, cultures, Scr, fever trend  Thank you. Okey RegalLisa Srihitha Tagliaferri, PharmD [phone]   06/09/2014,11:04 AM

## 2014-06-09 NOTE — Progress Notes (Signed)
Utilization Review Completed.Sher Shampine T3/05/2014  

## 2014-06-10 ENCOUNTER — Inpatient Hospital Stay (HOSPITAL_COMMUNITY): Payer: Medicaid - Out of State

## 2014-06-10 DIAGNOSIS — J690 Pneumonitis due to inhalation of food and vomit: Secondary | ICD-10-CM

## 2014-06-10 DIAGNOSIS — E871 Hypo-osmolality and hyponatremia: Secondary | ICD-10-CM

## 2014-06-10 DIAGNOSIS — B373 Candidiasis of vulva and vagina: Secondary | ICD-10-CM

## 2014-06-10 DIAGNOSIS — E876 Hypokalemia: Secondary | ICD-10-CM

## 2014-06-10 DIAGNOSIS — B3731 Acute candidiasis of vulva and vagina: Secondary | ICD-10-CM | POA: Insufficient documentation

## 2014-06-10 DIAGNOSIS — J9601 Acute respiratory failure with hypoxia: Secondary | ICD-10-CM

## 2014-06-10 DIAGNOSIS — G9340 Encephalopathy, unspecified: Secondary | ICD-10-CM

## 2014-06-10 DIAGNOSIS — G40909 Epilepsy, unspecified, not intractable, without status epilepticus: Secondary | ICD-10-CM

## 2014-06-10 DIAGNOSIS — J189 Pneumonia, unspecified organism: Secondary | ICD-10-CM | POA: Insufficient documentation

## 2014-06-10 DIAGNOSIS — A419 Sepsis, unspecified organism: Secondary | ICD-10-CM

## 2014-06-10 LAB — COMPREHENSIVE METABOLIC PANEL
ALBUMIN: 2.4 g/dL — AB (ref 3.5–5.2)
ALK PHOS: 62 U/L (ref 39–117)
ALT: 14 U/L (ref 0–35)
AST: 31 U/L (ref 0–37)
Anion gap: 8 (ref 5–15)
BUN: <5 mg/dL — ABNORMAL LOW (ref 6–23)
CHLORIDE: 103 mmol/L (ref 96–112)
CO2: 23 mmol/L (ref 19–32)
CREATININE: 0.57 mg/dL (ref 0.50–1.10)
Calcium: 7.9 mg/dL — ABNORMAL LOW (ref 8.4–10.5)
GFR calc Af Amer: >90 mL/min (ref >90)
GFR calc non Af Amer: >90 mL/min (ref >90)
Glucose, Bld: 128 mg/dL — ABNORMAL HIGH (ref 70–99)
POTASSIUM: 3 mmol/L — AB (ref 3.5–5.1)
Sodium: 134 mmol/L — ABNORMAL LOW (ref 135–145)
Total Bilirubin: 1.1 mg/dL (ref 0.3–1.2)
Total Protein: 5.6 g/dL — ABNORMAL LOW (ref 6.0–8.3)

## 2014-06-10 LAB — MAGNESIUM: MAGNESIUM: 1.9 mg/dL (ref 1.5–2.5)

## 2014-06-10 LAB — VALPROIC ACID LEVEL: Valproic Acid Lvl: 122.5 ug/mL — ABNORMAL HIGH (ref 50.0–100.0)

## 2014-06-10 LAB — URINE CULTURE
CULTURE: NO GROWTH
Colony Count: NO GROWTH

## 2014-06-10 LAB — CBC
HCT: 34.3 % — ABNORMAL LOW (ref 36.0–46.0)
Hemoglobin: 11.6 g/dL — ABNORMAL LOW (ref 12.0–15.0)
MCH: 30.4 pg (ref 26.0–34.0)
MCHC: 33.8 g/dL (ref 30.0–36.0)
MCV: 89.8 fL (ref 78.0–100.0)
PLATELETS: 204 10*3/uL (ref 150–400)
RBC: 3.82 MIL/uL — AB (ref 3.87–5.11)
RDW: 14 % (ref 11.5–15.5)
WBC: 20.8 10*3/uL — AB (ref 4.0–10.5)

## 2014-06-10 LAB — GLUCOSE, CAPILLARY
Glucose-Capillary: 137 mg/dL — ABNORMAL HIGH (ref 70–99)
Glucose-Capillary: 102 mg/dL — ABNORMAL HIGH (ref 70–99)

## 2014-06-10 LAB — LEVETIRACETAM LEVEL: Levetiracetam Lvl: 71 ug/mL — ABNORMAL HIGH (ref 10.0–40.0)

## 2014-06-10 MED ORDER — DEXTROSE-NACL 5-0.9 % IV SOLN
INTRAVENOUS | Status: DC
  Administered 2014-06-10 – 2014-06-13 (×4): via INTRAVENOUS
  Filled 2014-06-10 (×9): qty 1000

## 2014-06-10 MED ORDER — VALPROATE SODIUM 500 MG/5ML IV SOLN
500.0000 mg | Freq: Two times a day (BID) | INTRAVENOUS | Status: DC
  Administered 2014-06-10 – 2014-06-13 (×7): 500 mg via INTRAVENOUS
  Filled 2014-06-10 (×9): qty 5

## 2014-06-10 MED ORDER — POTASSIUM CHLORIDE 10 MEQ/100ML IV SOLN
10.0000 meq | INTRAVENOUS | Status: AC
  Administered 2014-06-10 (×3): 10 meq via INTRAVENOUS
  Filled 2014-06-10 (×3): qty 100

## 2014-06-10 MED ORDER — SODIUM CHLORIDE 0.9 % IV BOLUS (SEPSIS)
250.0000 mL | Freq: Once | INTRAVENOUS | Status: AC
  Administered 2014-06-10: 250 mL via INTRAVENOUS

## 2014-06-10 NOTE — Progress Notes (Signed)
Subjective: No seizures overnight.   Exam: Filed Vitals:   06/10/14 0700  BP:   Pulse:   Temp: 98.4 F (36.9 C)  Resp:    Gen: In bed, NAD MS: grimaces to pain.  ZO:XWRUECN:PERRL Motor: withdraws right arm and leg, weakness of the left arm  Sensory:as above.    Impression: 44 yo  F with presented with increase in seizure frequency to the point of essentially being in status epilepticus now with much improvment. She had a low depakote level and a yeast vagintis which could lower seizure threshold.   Recommendations: 1) continue keppra, depakote, vimpat.  2) check depakote level.  3) will continue to follow.   Ritta SlotMcNeill Kirkpatrick, MD [facility] [phone]  If 7pm- 7am, please page neurology on call as listed in AMION.

## 2014-06-10 NOTE — Progress Notes (Signed)
SLP Cancellation Note  Patient Details Name: Rachel GaskinsDamanta Bush MRN: [id number] DOB: 10/01/1970   Cancelled treatment:       Reason Eval/Treat Not Completed: Patient not medically ready;Fatigue/lethargy limiting ability to participate;Patient's level of consciousness. Improving from a respiratory standpoint but not alert enough for evaluation.  Ferdinand LangoLeah Lamarius Dirr MA, CCC-SLP [phone]    [name] 06/10/2014, 10:06 AM

## 2014-06-10 NOTE — Progress Notes (Signed)
[place] TEAM 1 - Stepdown/ICU TEAM Progress Note  Marin Wisner ZOX:[id number] DOB: [birth year] DOA: 06/08/2014 PCP: No primary care provider on file.  Admit HPI / Brief Narrative: 44 year old [place] female with history of severe cognitive impairment (nonverbal, nonambulatory at baseline - fully dependent on ADLs), seizure disorder, and left hemiparesis who presented to [facility] with frequent seizures.  History was obtained from the daughter-in-law who is able to communicate in [place], as well as via an interpreter. Starting the night prior to her admit the patient had multiple seizure episodes occurring every 5 minutes. Neurology was consulted, patient was given an additional dose of IV Keppra, IV Depakote and then Vimpat was started. According to Neurologist patient has clinical seizures without status epilepticus by EEG monitoring. In ED CT head was negative for acute findings, lactic acid was elevated at  2.83, WBCs 22.1, and CXR and UA with no abnormalities. 3/2- pt with evidence of aspiration pneumonia, placed on IV Vanc/Zosyn.   HPI/Subjective: Somnolent but opens eyes, non verbal. Nephew at bedside, states non seizure activity overnight   Assessment/Plan:  Sepsis secondary to L lung Aspiration pneumonia/pneumonitis  -pt with T 101.2, WBCs 20.8, RR 30, dense L lung infiltrates noted on CXR -Cont Zosyn  -Lactic acid normal at 1.8 -500 NS bolus X1 - continue D5 NS at 142ml/hr -blood- NGTD, sputum cultures pending  -sepsis etiology improving, not resolved  Acute hypoxic respiratory failure due to L lung aspiration pneumonitis/PNA -Currently stable on 3L [place], weaned from Ventimask.  -ABG 7.4/33/45. CXR notes marked new infiltrate compared w/ admit CXR across L lung fields, worse in LLL -3/3- repeat CXR with improvement in pneumonia  Seizure Disorder -No seizure activity overnight -EEG + for clinical seizure -Per Neurology- cont Depakote, Keppra, Vimpat, and Ativan PRN. Seizure  threshold lowered w/ yeast infection and low depakote level -repeat depakote elevated 122.5  Vaginal candidiasis -Nurse reports candidal vaginitis -Cont IV Diflucan- unable to take PO  Hypokalemia -replete with IVK , and D5 NS with 20 meq at 125 ml/hr -mag normal  Hyponatremia -likely dilutional -repeat BMET in am  Acute encephalopathy -improved-somnolent opens her eyes  -CT head with no acute findings  Severe cognitive impairment and left hemiparesis -as per her baseline   Code Status: FULL Family Communication: multiple family members at bedside Disposition Plan: SDU   Consultants: Neurology  Procedures: EEG 03/01: abnormal with findings consistent with moderately severe diffuse encephalopaty, as well as seizure disorder with a generalized seizure recorded during the study as well as what appears to be an epileptogenic focus involving the right hemisphere.  Culture Blood X2- NGTD Urine  Antibiotics: IV Diflucan 3/2>>  DVT prophylaxis: SQ Lovenox   Devices None  LINES / TUBES:  None    Objective: Blood pressure 90/56, pulse 87, temperature 98.4 F (36.9 C), temperature source Axillary, resp. rate 25, height  (1.473 m), weight 42.2 kg (93 lb 0.6 oz), SpO2 100 %.  Intake/Output Summary (Last 24 hours) at 06/10/14 1122 Last data filed at 06/10/14 0800  Gross per 24 hour  Intake    400 ml  Output   2100 ml  Net  -1700 ml   Exam: General: cachectic female, somnolent but opens eyes. On [place] O2 Lungs: good air movement bilaterally, mild crackles at L lungs fields  Cardiovascular: Tachycardic, without murmur gallop or rub normal S1 and S2 Abdomen: Nontender, nondistended, soft, bowel sounds positive, no rebound, no ascites, no appreciable mass Extremities: No significant cyanosis, clubbing, or edema bilateral lower  extremities  Data Reviewed: Basic Metabolic Panel:  Recent Labs Lab 06/08/14 1002 06/09/14 0314 06/10/14 0250 06/10/14 0845  NA  137 136 134*  --   K 4.0 3.3* 3.0*  --   CL 106 108 103  --   CO2 --   GLUCOSE 101* 87 128*  --   BUN 23 11 <5*  --   CREATININE 1.01 0.54 0.57  --   CALCIUM 9.7 7.9* 7.9*  --   MG 1.8  --   --  1.9   Liver Function Tests:  Recent Labs Lab 06/08/14 1002 06/10/14 0250  AST 31 31  ALT 19 14  ALKPHOS 83 62  BILITOT 1.2 1.1  PROT 7.9 5.6*  ALBUMIN 4.2 2.4*   CBC:  Recent Labs Lab 06/08/14 1002 06/09/14 0314 06/10/14 0250  WBC 22.1* 20.8* 20.8*  NEUTROABS 20.0*  --   --   HGB 13.1 11.5* 11.6*  HCT 39.0 34.9* 34.3*  MCV 89.2 90.6 89.8  PLT 280 233 204    CBG:  Recent Labs Lab 06/08/14 1009 06/08/14 1605 06/10/14 0854  GLUCAP 91 93 137*    Recent Results (from the past 240 hour(s))  MRSA PCR Screening     Status: None   Collection Time: 06/08/14  4:11 PM  Result Value Ref Range Status   MRSA by PCR NEGATIVE NEGATIVE Final    Comment:        The GeneXpert MRSA Assay (FDA approved for NASAL specimens only), is one component of a comprehensive MRSA colonization surveillance program. It is not intended to diagnose MRSA infection nor to guide or monitor treatment for MRSA infections.   Culture, blood (routine x 2)     Status: None (Preliminary result)   Collection Time: 06/09/14  9:40 AM  Result Value Ref Range Status   Specimen Description BLOOD HAND LEFT  Final   Special Requests BOTTLES DRAWN AEROBIC ONLY 2CC  Final   Culture   Final           BLOOD CULTURE RECEIVED NO GROWTH TO DATE CULTURE WILL BE HELD FOR 5 DAYS BEFORE ISSUING A FINAL NEGATIVE REPORT Performed at [facility]    Report Status PENDING  Incomplete  Culture, blood (routine x 2)     Status: None (Preliminary result)   Collection Time: 06/09/14 11:10 AM  Result Value Ref Range Status   Specimen Description BLOOD HAND LEFT  Final   Special Requests BOTTLES DRAWN AEROBIC ONLY 5CC  Final   Culture   Final           BLOOD CULTURE RECEIVED NO GROWTH TO DATE CULTURE WILL  BE HELD FOR 5 DAYS BEFORE ISSUING A FINAL NEGATIVE REPORT Performed at [facility]    Report Status PENDING  Incomplete     Studies:  Recent x-ray studies have been reviewed in detail by the Attending Physician  Scheduled Meds:  Scheduled Meds: . enoxaparin (LOVENOX) injection  30 mg Subcutaneous Q24H  . fluconazole (DIFLUCAN) IV  150 mg Intravenous Q72H  . Influenza vac split quadrivalent PF  0.5 mL Intramuscular Tomorrow-1000  . lacosamide (VIMPAT) IV  100 mg Intravenous Q12H  . levETIRAcetam  1,500 mg Intravenous Q12H  . piperacillin-tazobactam (ZOSYN)  IV  3.375 g Intravenous 3 times per day  . sodium chloride  250 mL Intravenous Once  . sodium chloride  3 mL Intravenous Q12H  . valproate sodium  1 g Intravenous Once  . valproate sodium  500 mg Intravenous 3 times per day    Time spent on care of this patient: 35 mins   Illa LevelOsman, Sahar , [facility]  [facility] Office  [phone] Pager - [phone]  On-Call/Text Page:      [email or web address]      password [email or web address]  If 7PM-7AM, please contact night-coverage [email or web address] Password [email or web address] 06/10/2014, 11:22 AM   LOS: 2 days    Examined Patient and discussed A&P with PA Sahar and agree with above plan. I have reviewed the entire database. I have made any necessary editorial changes, and agree with its content. I have reviewed this patient's available data, including medical history, events of note, physical examination, radiology studies and test results as part of my evaluation Pt with Multiple Complex medical problems> 35 min spent in direct Pt care    Carolyne Littlesurtis Woods, MD  [facility]  [phone] pager

## 2014-06-11 LAB — GLUCOSE, CAPILLARY
GLUCOSE-CAPILLARY: 115 mg/dL — AB (ref 70–99)
Glucose-Capillary: 146 mg/dL — ABNORMAL HIGH (ref 70–99)
Glucose-Capillary: 84 mg/dL (ref 70–99)

## 2014-06-11 LAB — BASIC METABOLIC PANEL
Anion gap: 7 (ref 5–15)
BUN: <5 mg/dL — ABNORMAL LOW (ref 6–23)
CALCIUM: 8 mg/dL — AB (ref 8.4–10.5)
CO2: 21 mmol/L (ref 19–32)
Chloride: 111 mmol/L (ref 96–112)
Creatinine, Ser: 0.44 mg/dL — ABNORMAL LOW (ref 0.50–1.10)
GFR calc non Af Amer: >90 mL/min (ref >90)
Glucose, Bld: 138 mg/dL — ABNORMAL HIGH (ref 70–99)
POTASSIUM: 3.6 mmol/L (ref 3.5–5.1)
SODIUM: 139 mmol/L (ref 135–145)

## 2014-06-11 LAB — CBC
HCT: 32.3 % — ABNORMAL LOW (ref 36.0–46.0)
Hemoglobin: 10.7 g/dL — ABNORMAL LOW (ref 12.0–15.0)
MCH: 30.1 pg (ref 26.0–34.0)
MCHC: 33.1 g/dL (ref 30.0–36.0)
MCV: 90.7 fL (ref 78.0–100.0)
Platelets: 210 10*3/uL (ref 150–400)
RBC: 3.56 MIL/uL — AB (ref 3.87–5.11)
RDW: 14.2 % (ref 11.5–15.5)
WBC: 19.7 10*3/uL — ABNORMAL HIGH (ref 4.0–10.5)

## 2014-06-11 LAB — VALPROIC ACID LEVEL: Valproic Acid Lvl: 93.5 ug/mL (ref 50.0–100.0)

## 2014-06-11 MED ORDER — ACETAMINOPHEN 325 MG PO TABS
650.0000 mg | ORAL_TABLET | Freq: Four times a day (QID) | ORAL | Status: DC | PRN
  Administered 2014-06-12: 650 mg via ORAL
  Filled 2014-06-11: qty 2

## 2014-06-11 MED ORDER — SODIUM CHLORIDE 0.9 % IV BOLUS (SEPSIS)
250.0000 mL | Freq: Once | INTRAVENOUS | Status: AC
  Administered 2014-06-11: 250 mL via INTRAVENOUS

## 2014-06-11 MED ORDER — PIPERACILLIN-TAZOBACTAM IN DEX 2-0.25 GM/50ML IV SOLN
2.2500 g | Freq: Three times a day (TID) | INTRAVENOUS | Status: AC
  Administered 2014-06-11 (×2): 2.25 g via INTRAVENOUS
  Filled 2014-06-11 (×2): qty 50

## 2014-06-11 MED ORDER — FLUCONAZOLE IN SODIUM CHLORIDE 200-0.9 MG/100ML-% IV SOLN
150.0000 mg | INTRAVENOUS | Status: AC
Start: 2014-06-11 — End: 2014-06-13
  Administered 2014-06-11 – 2014-06-13 (×3): 150 mg via INTRAVENOUS
  Filled 2014-06-11 (×4): qty 75

## 2014-06-11 NOTE — Progress Notes (Signed)
Pt BP 86/49. Has had soft BPs all night. Benedetto Coons. Callahan, NP called and received new orders. 250cc bolus given.

## 2014-06-11 NOTE — Progress Notes (Signed)
Oxygen Saturation at 100% on Room air. Rechecked and changed placement.

## 2014-06-11 NOTE — Evaluation (Signed)
Clinical/Bedside Swallow Evaluation Patient Details  Name: Rachel Bush MRN: [id number] Date of Birth: 06/25/1970  Today's Date: 06/11/2014 Time: SLP Start Time (ACUTE ONLY): 1002 SLP Stop Time (ACUTE ONLY): 1031 SLP Time Calculation (min) (ACUTE ONLY): 29 min  Past Medical History:  Past Medical History  Diagnosis Date  . Seizures    Past Surgical History: History reviewed. No pertinent past surgical history. HPI:  44 year old female admitted 06/08/14 due to intractible seizures. PMH significant for cerebral palsy with severe cognitive impairment, nonverbal at baseline, left hemi, total care, seizures. CT negative for acute findings. Initial CXR was clear, CXR 06/09/14 revealed LLL infiltrate.   Assessment / Plan / Recommendation Clinical Impression  Pt more alert this morning compared to previous SLP attempts, with nephew at bedside reporting that pt has frequently been requesting water. Cognition continues to impact swallowing function with reduced awareness and oral holding, resulting in likely aspiration of thin liquids via cup as evidenced by strong, prolonged cough response. Single spoonfuls of water at a time do not elicit overt s/s of aspiration with Mod multimodal cues from SLP with assistance from nephew in order to swallow. Pureed foods are also orally held, however with prompting to initiate swallow she is able to adequately clear her oral cavity. As discussed with RN, pt would be appropriate for small amounts of purees and thin liquids by spoon only as she remains alert - do not anticipate a significant amount of intake today due to cognitive status. However, will initiate Dys 1 diet and thin liquids in order to provide small amounts of POs throughout the day under full staff supervision.    Aspiration Risk  Moderate    Diet Recommendation Dysphagia 1 (Puree);Thin liquid   Liquid Administration via: Spoon Medication Administration: Crushed with puree Supervision: Staff to assist  with self feeding;Full supervision/cueing for compensatory strategies Compensations: Slow rate;Small sips/bites (watch for oral holding) Postural Changes and/or Swallow Maneuvers: Seated upright 90 degrees    Other  Recommendations Oral Care Recommendations: Oral care BID   Follow Up Recommendations   (tba)    Frequency and Duration min 2x/week  2 weeks   Pertinent Vitals/Pain [facility]    SLP Swallow Goals     Swallow Study Prior Functional Status       General HPI: 44 year old female admitted 06/08/14 due to intractible seizures. PMH significant for cerebral palsy with severe cognitive impairment, nonverbal at baseline, left hemi, total care, seizures. CT negative for acute findings. Initial CXR was clear, CXR 06/09/14 revealed LLL infiltrate. Type of Study: Bedside swallow evaluation Previous Swallow Assessment: none in chart, nephew says she ate regular diet, thin liquids PTA Diet Prior to this Study: NPO Temperature Spikes Noted: Yes Respiratory Status: Room air History of Recent Intubation: No Behavior/Cognition: Alert;Requires cueing Oral Cavity - Dentition: Poor condition Self-Feeding Abilities: Total assist Patient Positioning: Upright in bed Baseline Vocal Quality: Clear    Oral/Motor/Sensory Function     Ice Chips Ice chips: Not tested   Thin Liquid Thin Liquid: Impaired Presentation: Cup;Spoon Oral Phase Impairments: Poor awareness of bolus Oral Phase Functional Implications: Oral holding Pharyngeal  Phase Impairments: Suspected delayed Swallow;Cough - Immediate    Nectar Thick Nectar Thick Liquid: Not tested   Honey Thick Honey Thick Liquid: Not tested   Puree Puree: Impaired Presentation: Spoon Oral Phase Functional Implications: Oral holding   Solid    Solid: Not tested      Rachel HamLaura Bush, M.A. CCC-SLP [phone]  Rachel Hamaiewonsky, Rachel Bush 06/11/2014,10:44 AM

## 2014-06-11 NOTE — Progress Notes (Signed)
Subjective: No further seizures over night.   Exam: Filed Vitals:   06/11/14 0702  BP: 92/59  Pulse:   Temp: 96.2 F (35.7 C)  Resp:    Gen: In bed, NAD MS: moaning, tracts me in room, grimaces to pain ZO:[id number]:PERRLA, EOMI, face symmetric Motor: withdraws both arms and legs to noxious stimuli Sensory: as above   Pertinent Labs: WBC 19.7 VPA level on 3/3 122.5  today level pending  Impression: 44 yo F with presented with increase in seizure frequency to the point of essentially being in status epilepticus now with much improvment. She had a low depakote level and a yeast vagintis which could lower seizure threshold. Recent Depakote level 122 will recheck level today  Recommendations: 1) continue keppra, depakote, vimpat.  2) check depakote level.  3) will continue to follow.   Felicie MornDavid Smith PA-C [facility] [phone]  06/11/2014, 10:08 AM   I have seen and evaluated the patient. I have reviewed the above note and made appropriate changes. Have decreased depakote to try to allow her to wake up some, will ensure level is coming down. Will need to be changed to PO medications once taking PO.   Ritta SlotMcNeill Jamarques Pinedo, MD [facility] [phone]  If 7pm- 7am, please page neurology on call as listed in AMION.

## 2014-06-11 NOTE — Progress Notes (Signed)
PT ate 11 teaspoon of puree food, 5 teaspoon of milk and 3 tsp of tea, then indicated she is done eating.

## 2014-06-11 NOTE — Progress Notes (Signed)
Patient arrived to unit via bed. Alert, maes, no c/o pain or distress noted. Patient's nephew at bedside, bed alarm set, call bell within reach, oriented to unit, and safety protocol was explain to nephew regarding patient.  Will continue to monitor. Marin RobertsAisha Chyanne Kohut RN

## 2014-06-11 NOTE — Progress Notes (Signed)
TEAM 1 - Stepdown/ICU TEAM Progress Note  Lucia GaskinsDamanta Nygaard JWJ:[id number] DOB: 10/06/1970 DOA: 06/08/2014 PCP: No primary care provider on file.  Admit HPI / Brief Narrative: 44 year old [place] female with history of severe cognitive impairment (nonverbal, nonambulatory at baseline - fully dependent on ADLs), seizure disorder, and left hemiparesis [facility].  History was obtained from the daughter-in-law who is able to communicate in [place], as well as via an interpreter. Starting the night prior to her admit the patient had multiple seizure episodes occurring every 5 minutes. Neurology was consulted, patient was given an additional dose of IV Keppra, IV Depakote and then Vimpat was started. According to Neurologist patient has clinical seizures without status epilepticus by EEG monitoring.   In ED CT head was negative for acute findings, lactic acid was elevated at  2.83, WBCs 22.1, and CXR and UA with no abnormalities.   3/2- pt with evidence of aspiration pneumonia, placed on IV Vanc/Zosyn.   HPI/Subjective: Pt is more alert today.  She will open her eyes, and appears to follow the examiner.  Her nephew who speaks English is at the bedside.  He states she is closer to her normal self today.    Assessment/Plan:  Sepsis secondary to L lung Aspiration pneumonia/pneumonitis  -pt with WBCs 20.8, RR 30, dense L lung infiltrates noted on CXR -emperic Zosyn for 3 day course    -Lactic acid normal at 1.8 -blood, sputum cultures unrevealing  -sepsis has essentially resolved clinically - resp status much improved/stable    Acute hypoxic respiratory failure due to L lung aspiration pneumonitis/PNA -CXR 3/3 noted improvement in infiltrate -resp status greatly improved today   Seizure Disorder -no further seizure activity appreciated over last 24hrs  -EEG + for clinical seizure -Per Neurology- placed on Depakote Keppra, Vimpat, and Ativan PRN - source  for breakthrough seizure possibly yeast infection  Yeast Vaginitis -Nurse reported candidal vaginitis -Diflucan for 3 days of tx   Acute encephalopathy -mental status appears to be approaching baseline w/ arrest of ongoing sz activity  -CT head with no acute findings  Mild Hypokalemia -corrected  - Mg ok  Severe cognitive impairment and left hemiparesis -as per her baseline   Code Status: FULL Family Communication: Nephew at bedside Disposition Plan: stable for transfer to Neuro bed - advance diet and follow intake - follow resp status - watch for breakthrough seizures - complete zosyn and diflucan courses   Consultants: Neurology  Procedures: EEG 03/01: abnormal with findings consistent with moderately severe diffuse encephalopaty, as well as seizure disorder with a generalized seizure recorded during the study as well as what appears to be an epileptogenic focus involving the right hemisphere.  Antibiotics: Diflucan 3/2 > Zosyn 3/2 >  DVT prophylaxis: SQ Lovenox  Objective: Blood pressure 92/59, pulse 88, temperature 97.7 F (36.5 C), temperature source Oral, resp. rate 24, height 4\' 10"  (1.473 m), weight 42.2 kg (93 lb 0.6 oz), SpO2 99 %.  Intake/Output Summary (Last 24 hours) at 06/11/14 1137 Last data filed at 06/11/14 1103  Gross per 24 hour  Intake   2900 ml  Output   2725 ml  Net    175 ml   Exam: General: cachectic female, no acute resp distress on RA Lungs: air movement noted equally th/o L and R lung fields - no wheeze  Cardiovascular: Regular rate and rhythm without murmur gallop or rub normal S1 and S2 Abdomen: Nontender, nondistended, soft, bowel sounds positive, no rebound,  no ascites, no appreciable mass Extremities: No significant cyanosis, clubbing, or edema bilateral lower extremities  Data Reviewed: Basic Metabolic Panel:  Recent Labs Lab 06/08/14 1002 06/09/14 0314 06/10/14 0250 06/10/14 0845 06/11/14 0255  NA 137 136 134*  --  139  K  4.0 3.3* 3.0*  --  3.6  CL 106 108 103  --  111  CO2 --  21  GLUCOSE 101* 87 128*  --  138*  BUN 23 11 <5*  --  <5*  CREATININE 1.01 0.54 0.57  --  0.44*  CALCIUM 9.7 7.9* 7.9*  --  8.0*  MG 1.8  --   --  1.9  --    Liver Function Tests:  Recent Labs Lab 06/08/14 1002 06/10/14 0250  AST 31 31  ALT 19 14  ALKPHOS 83 62  BILITOT 1.2 1.1  PROT 7.9 5.6*  ALBUMIN 4.2 2.4*   CBC:  Recent Labs Lab 06/08/14 1002 06/09/14 0314 06/10/14 0250 06/11/14 0255  WBC 22.1* 20.8* 20.8* 19.7*  NEUTROABS 20.0*  --   --   --   HGB 13.1 11.5* 11.6* 10.7*  HCT 39.0 34.9* 34.3* 32.3*  MCV 89.2 90.6 89.8 90.7  PLT 280 233 204 210    CBG:  Recent Labs Lab 06/08/14 1605 06/10/14 0854 06/10/14 1642 06/10/14 2357 06/11/14 0737  GLUCAP 93 137* 102* 115* 146*    Recent Results (from the past 240 hour(s))  MRSA PCR Screening     Status: None   Collection Time: 06/08/14  4:11 PM  Result Value Ref Range Status   MRSA by PCR NEGATIVE NEGATIVE Final    Comment:        The GeneXpert MRSA Assay (FDA approved for NASAL specimens only), is one component of a comprehensive MRSA colonization surveillance program. It is not intended to diagnose MRSA infection nor to guide or monitor treatment for MRSA infections.   Culture, blood (routine x 2)     Status: None (Preliminary result)   Collection Time: 06/09/14  9:40 AM  Result Value Ref Range Status   Specimen Description BLOOD HAND LEFT  Final   Special Requests BOTTLES DRAWN AEROBIC ONLY 2CC  Final   Culture   Final           BLOOD CULTURE RECEIVED NO GROWTH TO DATE CULTURE WILL BE HELD FOR 5 DAYS BEFORE ISSUING A FINAL NEGATIVE REPORT Performed at [facility]    Report Status PENDING  Incomplete  Culture, blood (routine x 2)     Status: None (Preliminary result)   Collection Time: 06/09/14 11:10 AM  Result Value Ref Range Status   Specimen Description BLOOD HAND LEFT  Final   Special Requests BOTTLES DRAWN  AEROBIC ONLY 5CC  Final   Culture   Final           BLOOD CULTURE RECEIVED NO GROWTH TO DATE CULTURE WILL BE HELD FOR 5 DAYS BEFORE ISSUING A FINAL NEGATIVE REPORT Performed at [facility]    Report Status PENDING  Incomplete  Culture, Urine     Status: None   Collection Time: 06/09/14  1:02 PM  Result Value Ref Range Status   Specimen Description URINE, CATHETERIZED  Final   Special Requests NONE  Final   Colony Count NO GROWTH Performed at [facility]   Final   Culture NO GROWTH Performed at [facility]   Final   Report Status 06/10/2014 FINAL  Final  Studies:  Recent x-ray studies have been reviewed in detail by the Attending Physician  Scheduled Meds:  Scheduled Meds: . enoxaparin (LOVENOX) injection  30 mg Subcutaneous Q24H  . fluconazole (DIFLUCAN) IV  150 mg Intravenous Q24H  . lacosamide (VIMPAT) IV  100 mg Intravenous Q12H  . levETIRAcetam  1,500 mg Intravenous Q12H  . piperacillin-tazobactam (ZOSYN)  IV  3.375 g Intravenous 3 times per day  . sodium chloride  3 mL Intravenous Q12H  . valproate sodium  1 g Intravenous Once  . valproate sodium  500 mg Intravenous Q12H    Time spent on care of this patient: 35 mins  Lonia Blood, MD [facility] For Consults/Admissions - Flow Manager - [phone] Office  [phone]  Contact MD directly via text page:      [email or web address]      password [facility]  06/11/2014, 11:37 AM   LOS: 3 days

## 2014-06-12 LAB — CBC
HCT: 30.5 % — ABNORMAL LOW (ref 36.0–46.0)
HEMOGLOBIN: 10.4 g/dL — AB (ref 12.0–15.0)
MCH: 30.2 pg (ref 26.0–34.0)
MCHC: 34.1 g/dL (ref 30.0–36.0)
MCV: 88.7 fL (ref 78.0–100.0)
PLATELETS: 219 10*3/uL (ref 150–400)
RBC: 3.44 MIL/uL — ABNORMAL LOW (ref 3.87–5.11)
RDW: 14.1 % (ref 11.5–15.5)
WBC: 13 10*3/uL — AB (ref 4.0–10.5)

## 2014-06-12 MED ORDER — OXYCODONE HCL 5 MG PO TABS
5.0000 mg | ORAL_TABLET | Freq: Four times a day (QID) | ORAL | Status: DC | PRN
  Administered 2014-06-12 – 2014-06-13 (×2): 5 mg via ORAL
  Filled 2014-06-12 (×3): qty 1

## 2014-06-12 NOTE — Progress Notes (Signed)
[place] TEAM 1 - Stepdown/ICU TEAM Progress Note  Anea Fodera ZOX:[id number] DOB: 10-14-1970 DOA: 06/08/2014 PCP: No primary care provider on file.  Admit HPI / Brief Narrative: 44 year old [place] female with history of severe cognitive impairment (nonverbal, nonambulatory at baseline - fully dependent on ADLs), seizure disorder, and left hemiparesis who presented to [facility] with frequent seizures.  History was obtained from the daughter-in-law who is able to communicate in [place], as well as via an interpreter. Starting the night prior to her admit the patient had multiple seizure episodes occurring every 5 minutes. Neurology was consulted, patient was given an additional dose of IV Keppra, IV Depakote and then Vimpat was started. According to Neurologist patient has clinical seizures without status epilepticus by EEG monitoring.   In ED CT head was negative for acute findings, lactic acid was elevated at  2.83, WBCs 22.1, and CXR and UA with no abnormalities.   3/2- pt with evidence of aspiration pneumonia, placed on IV Vanc/Zosyn.   HPI/Subjective: Pt is more alert today.  She will open her eyes, and appears to follow the examiner.  Her nephew who speaks English is at the bedside.  Patient able to tell me her name and date of birth. As per nursing, patient looks a lot better today and is more awake, no seizures overnight    Assessment/Plan:  Sepsis secondary to L lung Aspiration pneumonia/pneumonitis  -pt with WBCs 20.8, leukocytosis improving 13.0 today, dense L lung infiltrates noted on CXR Continue empiric Zosyn until discharge, then switch  to Augmentin by mouth -Lactic acid normal at 1.8 -blood, sputum cultures unrevealing  -sepsis has essentially resolved clinically - resp status much improved/stable    Acute hypoxic respiratory failure due to L lung aspiration pneumonitis/PNA -CXR 3/3 noted improvement in infiltrate -resp status greatly improved today   Seizure Disorder with  status epilepticus -no further seizure activity appreciated over last 24hrs  -EEG + for clinical seizure -Per Neurology- placed on Depakote Keppra, Vimpat, and Ativan PRN -  source for breakthrough seizure possibly yeast vaginitis PT eval  Yeast Vaginitis -Nurse reported candidal vaginitis -Diflucan for 3 days of tx   Acute encephalopathy -mental status appears to be approaching baseline w/ arrest of ongoing sz activity  -CT head with no acute findings  Mild Hypokalemia -corrected  - Mg ok  Severe cognitive impairment and left hemiparesis -as per her baseline  Speech therapy recommends Dysphagia 1 (Puree);Thin liquid   Code Status: FULL Family Communication: Nephew at bedside Disposition Plan: Anticipate discharge in one to 2 days  Consultants: Neurology  Procedures: EEG 03/01: abnormal with findings consistent with moderately severe diffuse encephalopaty, as well as seizure disorder with a generalized seizure recorded during the study as well as what appears to be an epileptogenic focus involving the right hemisphere.  Antibiotics: Diflucan 3/2 > Zosyn 3/2 >  DVT prophylaxis: SQ Lovenox  Objective: Blood pressure 113/62, pulse 71, temperature 98.8 F (37.1 C), temperature source Oral, resp. rate 16, height  (1.473 m), weight 42.2 kg (93 lb 0.6 oz), SpO2 99 %.  Intake/Output Summary (Last 24 hours) at 06/12/14 1237 Last data filed at 06/12/14 1207  Gross per 24 hour  Intake    750 ml  Output   2025 ml  Net  -1275 ml   Exam: General: cachectic female, no acute resp distress on RA Lungs: air movement noted equally th/o L and R lung fields - no wheeze  Cardiovascular: Regular rate and rhythm without murmur gallop or  rub normal S1 and S2 Abdomen: Nontender, nondistended, soft, bowel sounds positive, no rebound, no ascites, no appreciable mass Extremities: No significant cyanosis, clubbing, or edema bilateral lower extremities  Data Reviewed: Basic Metabolic  Panel:  Recent Labs Lab 06/08/14 1002 06/09/14 0314 06/10/14 0250 06/10/14 0845 06/11/14 0255  NA 137 136 134*  --  139  K 4.0 3.3* 3.0*  --  3.6  CL 106 108 103  --  111  CO2 19 22 23   --  21  GLUCOSE 101* 87 128*  --  138*  BUN 23 11 <5*  --  <5*  CREATININE 1.01 0.54 0.57  --  0.44*  CALCIUM 9.7 7.9* 7.9*  --  8.0*  MG 1.8  --   --  1.9  --    Liver Function Tests:  Recent Labs Lab 06/08/14 1002 06/10/14 0250  AST 31 31  ALT 19 14  ALKPHOS 83 62  BILITOT 1.2 1.1  PROT 7.9 5.6*  ALBUMIN 4.2 2.4*   CBC:  Recent Labs Lab 06/08/14 1002 06/09/14 0314 06/10/14 0250 06/11/14 0255 06/12/14 0350  WBC 22.1* 20.8* 20.8* 19.7* 13.0*  NEUTROABS 20.0*  --   --   --   --   HGB 13.1 11.5* 11.6* 10.7* 10.4*  HCT 39.0 34.9* 34.3* 32.3* 30.5*  MCV 89.2 90.6 89.8 90.7 88.7  PLT 280 233 204 210 219    CBG:  Recent Labs Lab 06/10/14 0854 06/10/14 1642 06/10/14 2357 06/11/14 0737 06/11/14 1522  GLUCAP 137* 102* 115* 146* 84    Recent Results (from the past 240 hour(s))  MRSA PCR Screening     Status: None   Collection Time: 06/08/14  4:11 PM  Result Value Ref Range Status   MRSA by PCR NEGATIVE NEGATIVE Final    Comment:        The GeneXpert MRSA Assay (FDA approved for NASAL specimens only), is one component of a comprehensive MRSA colonization surveillance program. It is not intended to diagnose MRSA infection nor to guide or monitor treatment for MRSA infections.   Culture, blood (routine x 2)     Status: None (Preliminary result)   Collection Time: 06/09/14  9:40 AM  Result Value Ref Range Status   Specimen Description BLOOD HAND LEFT  Final   Special Requests BOTTLES DRAWN AEROBIC ONLY 2CC  Final   Culture   Final           BLOOD CULTURE RECEIVED NO GROWTH TO DATE CULTURE WILL BE HELD FOR 5 DAYS BEFORE ISSUING A FINAL NEGATIVE REPORT Performed at [facility]    Report Status PENDING  Incomplete  Culture, blood (routine x 2)     Status:  None (Preliminary result)   Collection Time: 06/09/14 11:10 AM  Result Value Ref Range Status   Specimen Description BLOOD HAND LEFT  Final   Special Requests BOTTLES DRAWN AEROBIC ONLY 5CC  Final   Culture   Final           BLOOD CULTURE RECEIVED NO GROWTH TO DATE CULTURE WILL BE HELD FOR 5 DAYS BEFORE ISSUING A FINAL NEGATIVE REPORT Performed at [facility]    Report Status PENDING  Incomplete  Culture, Urine     Status: None   Collection Time: 06/09/14  1:02 PM  Result Value Ref Range Status   Specimen Description URINE, CATHETERIZED  Final   Special Requests NONE  Final   Colony Count NO GROWTH Performed at [facility]  Final   Culture NO GROWTH Performed at [facility]   Final   Report Status 06/10/2014 FINAL  Final     Studies:  Recent x-ray studies have been reviewed in detail by the Attending Physician  Scheduled Meds:  Scheduled Meds: . enoxaparin (LOVENOX) injection  30 mg Subcutaneous Q24H  . fluconazole (DIFLUCAN) IV  150 mg Intravenous Q24H  . lacosamide (VIMPAT) IV  100 mg Intravenous Q12H  . levETIRAcetam  1,500 mg Intravenous Q12H  . sodium chloride  3 mL Intravenous Q12H  . valproate sodium  500 mg Intravenous Q12H    Time spent on care of this patient: 35 mins  Richarda Overlie, MD [facility] For Consults/Admissions - Flow Manager - [phone] Office  [phone]  Contact MD directly via text page:      [email or web address]      password [facility]  06/12/2014, 12:37 PM   LOS: 4 days

## 2014-06-12 NOTE — Progress Notes (Signed)
PT Cancellation Note  Patient Details Name: Rachel Bush MRN: [id number] DOB: 09/12/1970   Cancelled Treatment:    Reason Eval/Treat Not Completed: Fatigue/lethargy limiting ability to participate. Pt sleeping.  Family prefers she not be woken and feels she would be unable to participate in PT eval today. PT to re-attempt tomorrow.   Ilda FoilGarrow, Amauria Younts Rene 06/12/2014, 3:25 PM

## 2014-06-12 NOTE — Progress Notes (Signed)
Subjective: Patient is much improved.   Exam: Filed Vitals:   06/12/14 0618  BP: 108/79  Pulse: 79  Temp: 98.5 F (36.9 C)  Resp: 16   Gen: In bed, NAD MS: Awake, alert, follows command to make a gesture from her nephew.  CN: PERRL, blinks to threat.  Motor: MAEW, including left arm, but does not participate with formal strength testing.  Sensory:responds to stimuli bilaterally.   Pertinent Labs: Vpa [id number]  Impression: 44 yo F with h/o seizure disorder and unclear neurological condition present since childhood requiring assistence with most ADLs. She presented in status epilepticus likely secondary to vaginitis. She has much improved. Given that her seizure control was sub optimal even prior to admission, it may be reasonable to add vimpat to her regimen, however if it is not helping, then could d/c it later and continue on her baseline keppra and depakote. At baseline she has a seizure every day to every few days.   Recommendations: 1) Continue keppra, depakote, vimpat.   Ritta SlotMcNeill Shanora Christensen, MD [facility] [phone]  If 7pm- 7am, please page neurology on call as listed in AMION.

## 2014-06-13 LAB — COMPREHENSIVE METABOLIC PANEL
ALK PHOS: 79 U/L (ref 39–117)
ALT: 13 U/L (ref 0–35)
AST: 19 U/L (ref 0–37)
Albumin: 2.3 g/dL — ABNORMAL LOW (ref 3.5–5.2)
Anion gap: 6 (ref 5–15)
BILIRUBIN TOTAL: 0.4 mg/dL (ref 0.3–1.2)
BUN: <5 mg/dL — ABNORMAL LOW (ref 6–23)
CO2: 27 mmol/L (ref 19–32)
CREATININE: 0.53 mg/dL (ref 0.50–1.10)
Calcium: 8.2 mg/dL — ABNORMAL LOW (ref 8.4–10.5)
Chloride: 102 mmol/L (ref 96–112)
GFR calc Af Amer: >90 mL/min (ref >90)
GFR calc non Af Amer: >90 mL/min (ref >90)
GLUCOSE: 94 mg/dL (ref 70–99)
Potassium: 3.3 mmol/L — ABNORMAL LOW (ref 3.5–5.1)
SODIUM: 135 mmol/L (ref 135–145)
Total Protein: 6.1 g/dL (ref 6.0–8.3)

## 2014-06-13 LAB — GLUCOSE, CAPILLARY: GLUCOSE-CAPILLARY: 88 mg/dL (ref 70–99)

## 2014-06-13 LAB — BLOOD GAS, ARTERIAL
ACID-BASE EXCESS: 0.8 mmol/L (ref 0.0–2.0)
BICARBONATE: 24.3 meq/L — AB (ref 20.0–24.0)
DRAWN BY: 330991
FIO2: 0.21 %
O2 Saturation: 94.9 %
PATIENT TEMPERATURE: 98.6
PH ART: 7.462 — AB (ref 7.350–7.450)
TCO2: 25.3 mmol/L (ref 0–100)
pCO2 arterial: 34.4 mmHg — ABNORMAL LOW (ref 35.0–45.0)
pO2, Arterial: 79.1 mmHg — ABNORMAL LOW (ref 80.0–100.0)

## 2014-06-13 MED ORDER — DIPHENHYDRAMINE HCL 50 MG/ML IJ SOLN
12.5000 mg | Freq: Four times a day (QID) | INTRAMUSCULAR | Status: DC | PRN
  Administered 2014-06-13: 12.5 mg via INTRAVENOUS
  Filled 2014-06-13: qty 1

## 2014-06-13 MED ORDER — AMOXICILLIN-POT CLAVULANATE 875-125 MG PO TABS
1.0000 | ORAL_TABLET | Freq: Two times a day (BID) | ORAL | Status: DC
  Administered 2014-06-13 – 2014-06-15 (×4): 1 via ORAL
  Filled 2014-06-13 (×5): qty 1

## 2014-06-13 MED ORDER — SODIUM CHLORIDE 0.9 % IV SOLN
INTRAVENOUS | Status: DC
  Administered 2014-06-13 – 2014-06-14 (×3): via INTRAVENOUS
  Filled 2014-06-13 (×5): qty 1000

## 2014-06-13 MED ORDER — DIPHENHYDRAMINE HCL 25 MG PO CAPS: 25.0000 mg | ORAL_CAPSULE | Freq: Four times a day (QID) | ORAL | Status: DC | PRN

## 2014-06-13 NOTE — Progress Notes (Signed)
OT Cancellation Note  Patient Details Name: Rachel GaskinsDamanta Bush MRN: [id number] DOB: 02/22/1971   Cancelled Treatment:    Reason Eval/Treat Not Completed: Other (comment)Per PT and chart, pt is dependent for ADLs at baseline. Acute OT will sign off at this time.    Rachel JordanMiller, Rachel Bush 06/13/2014, 10:06 AM

## 2014-06-13 NOTE — Progress Notes (Addendum)
[place] TEAM 1 - Stepdown/ICU TEAM Progress Note  Lucia GaskinsDamanta Mahabir UJW:[id number] DOB: 12/23/1970 DOA: 06/08/2014 PCP: No primary care provider on file.  Admit HPI / Brief Narrative: 44 year old [place] female with history of severe cognitive impairment (nonverbal, nonambulatory at baseline - fully dependent on ADLs), seizure disorder, and left hemiparesis who [facility].  History was obtained from the daughter-in-law who is able to communicate in [place], as well as via an interpreter. Starting the night prior to her admit the patient had multiple seizure episodes occurring every 5 minutes. Neurology was consulted, patient was given an additional dose of IV Keppra, IV Depakote and then Vimpat was started. According to Neurologist patient has clinical seizures without status epilepticus by EEG monitoring.   In ED CT head was negative for acute findings, lactic acid was elevated at  2.83, WBCs 22.1, and CXR and UA with no abnormalities.   3/2- pt with evidence of aspiration pneumonia, placed on IV Vanc/Zosyn.   HPI/Subjective:  somnolent overnight after receiving vimpat. This was discontinued by Dr. Petra KubaKilpatrick this morning. Family by the bedside. Patient still does not have any meaningful conversation even with the family.   In tears from the ABG. no seizures overnight    Assessment/Plan:  Sepsis secondary to L lung Aspiration pneumonia/pneumonitis  -pt with WBCs 20.8, leukocytosis improving 13.0 today, dense L lung infiltrates noted on CXR  Zosyn discontinued, switch to Augmentin by mouth3 days  -Lactic acid normal at 1.8 -blood, sputum cultures unrevstable from a respiratory standpoint ABG within normal limits  Acute  hypoxic respiratory failure due to L lung aspiration pneumonitis/PNA -CXR 3/3 noted improvement in infilstable  Hypokalemia Replete  Seizure disorder with status epilepticus -no further seizure activity appreciated over last 24hrs   continue Keppra, Depacon, when necessary Ativan   source for breakthrough seizure possibly yeast vaginitis PT eval no follow-up recommended neurology suspects noncompliance with medications  Yeast Vaginitis completed 3 days of Diflucan   Acute encephalopathy -mental status appears to be approaching baseline w/ arrest of ongoing sz activity  -CT head with no acute findings  Mild Hypokalemia -corrected  - Mg ok  Severe cognitive impairment and left hemiparesis -as per her baseline   Speech therapy recommends Dysphagia 1 (Puree);Thin liquid   Code Status: FULL Family Communication: Nephew at bedside Disposition Plan:  anticipate discharge tomorrow   Consultants: Neurology  Procedures: EEG 03/01: abnormal with findings consistent with moderately severe diffuse encephalopaty, as well as seizure disorder with a generalized seizure recorded during the study as well as what appears to be an epileptogenic focus involving the right hemisphere.  Antibiotics: Diflucan 3/2 > Zosyn 3/2 >  DVT prophylaxis: SQ Lovenox  Objective: Blood pressure 106/60, pulse 78, temperature 98.1 F (36.7 C), temperature source Axillary, resp. rate 20, height 4\' 10"  (1.473 m), weight 42.2 kg (93 lb 0.6 oz), SpO2 100 %.  Intake/Output Summary (Last 24 hours) at 06/13/14 1137 Last data filed at 06/13/14 [id number]  Gross per 24 hour  Intake     30 ml  Output   2175 ml  Net  -2145 ml   Exam: General: cachectic female, no acute resp distress on RA Lungs: air movement noted equally th/o L and R lung fields - no wheeze  Cardiovascular: Regular rate and rhythm without murmur gallop or rub normal S1 and S2 Abdomen: Nontender, nondistended, soft, bowel sounds positive, no rebound, no ascites, no appreciable mass Extremities: No significant cyanosis, clubbing, or edema bilateral lower extremities  Data Reviewed: Basic Metabolic Panel:  Recent Labs Lab 06/08/14 1002 06/09/14 0314 06/10/14 0250  06/10/14 0845 06/11/14 0255 06/13/14 0544  NA 137 136 134*  --  139 135  K 4.0 3.3* 3.0*  --  3.6 3.3*  CL 106 108 103  --  111 102  CO2 --  21 27  GLUCOSE 101* 87 128*  --  138* 94  BUN 23 11 <5*  --  <5* <5*  CREATININE 1.01 0.54 0.57  --  0.44* 0.53  CALCIUM 9.7 7.9* 7.9*  --  8.0* 8.2*  MG 1.8  --   --  1.9  --   --    Liver Function Tests:  Recent Labs Lab 06/08/14 1002 06/10/14 0250 06/13/14 0544  AST ALT ALKPHOS 83 62 79  BILITOT 1.2 1.1 0.4  PROT 7.9 5.6* 6.1  ALBUMIN 4.2 2.4* 2.3*   CBC:  Recent Labs Lab 06/08/14 1002 06/09/14 0314 06/10/14 0250 06/11/14 0255 06/12/14 0350  WBC 22.1* 20.8* 20.8* 19.7* 13.0*  NEUTROABS 20.0*  --   --   --   --   HGB 13.1 11.5* 11.6* 10.7* 10.4*  HCT 39.0 34.9* 34.3* 32.3* 30.5*  MCV 89.2 90.6 89.8 90.7 88.7  PLT 280 233 204 210 219    CBG:  Recent Labs Lab 06/10/14 1642 06/10/14 2357 06/11/14 0737 06/11/14 1522 06/13/14 0107  GLUCAP 102* 115* 146* 84 88    Recent Results (from the past 240 hour(s))  MRSA PCR Screening     Status: None   Collection Time: 06/08/14  4:11 PM  Result Value Ref Range Status   MRSA by PCR NEGATIVE NEGATIVE Final    Comment:        The GeneXpert MRSA Assay (FDA approved for NASAL specimens only), is one component of a comprehensive MRSA colonization surveillance program. It is not intended to diagnose MRSA infection nor to guide or monitor treatment for MRSA infections.   Culture, blood (routine x 2)     Status: None (Preliminary result)   Collection Time: 06/09/14  9:40 AM  Result Value Ref Range Status   Specimen Description BLOOD HAND LEFT  Final   Special Requests BOTTLES DRAWN AEROBIC ONLY 2CC  Final   Culture   Final           BLOOD CULTURE RECEIVED NO GROWTH TO DATE CULTURE WILL BE HELD FOR 5 DAYS BEFORE ISSUING A FINAL NEGATIVE REPORT Performed at [facility]    Report Status PENDING  Incomplete  Culture, blood (routine x  2)     Status: None (Preliminary result)   Collection Time: 06/09/14 11:10 AM  Result Value Ref Range Status   Specimen Description BLOOD HAND LEFT  Final   Special Requests BOTTLES DRAWN AEROBIC ONLY 5CC  Final   Culture   Final           BLOOD CULTURE RECEIVED NO GROWTH TO DATE CULTURE WILL BE HELD FOR 5 DAYS BEFORE ISSUING A FINAL NEGATIVE REPORT Performed at [facility]    Report Status PENDING  Incomplete  Culture, Urine     Status: None   Collection Time: 06/09/14  1:02 PM  Result Value Ref Range Status   Specimen Description URINE, CATHETERIZED  Final   Special Requests NONE  Final   Colony Count NO GROWTH Performed at [facility]   Final   Culture NO GROWTH Performed at [facility]   Final   Report Status 06/10/2014 FINAL  Final     Studies:  Recent x-ray studies have been reviewed in detail by the Attending Physician  Scheduled Meds:  Scheduled Meds: . enoxaparin (LOVENOX) injection  30 mg Subcutaneous Q24H  . fluconazole (DIFLUCAN) IV  150 mg Intravenous Q24H  . levETIRAcetam  1,500 mg Intravenous Q12H  . sodium chloride  3 mL Intravenous Q12H  . valproate sodium  500 mg Intravenous Q12H    Time spent on care of this patient: 35 mins  Richarda Overlie, MD [facility] For Consults/Admissions - Flow Manager - [phone] Office  [phone]  Contact MD directly via text page:      [email or web address]      password [facility]  06/13/2014, 11:37 AM   LOS: 5 days

## 2014-06-13 NOTE — Progress Notes (Signed)
Patient difficult to arouse at meal times; family concerned,"she has not eaten in days".  Notified Dr.Abrol and Dr.Kirkpatrick of sleep pattern; orders received to discontinue the Vimpat medication.  Patient is awakening now and attempting to eat with aspiration precautions in place.

## 2014-06-13 NOTE — Progress Notes (Signed)
Subjective: Patient is more awake, but not eating.   Exam: Filed Vitals:   06/13/14 1011  BP: 106/60  Pulse: 78  Temp: 98.1 F (36.7 C)  Resp: 20   Gen: In bed, NAD MS: Awake, alert, follows commands from family CN: PERRL, blinks to threat.  Motor: MAEW, including left arm, but does not participate with formal strength testing.  Sensory:responds to stimuli bilaterally.    Impression: 44 yo F with h/o seizure disorder and unclear neurological condition present since childhood requiring assistence with most ADLs. She presented in status epilepticus likely secondary to vaginitis. She has much improved. Given that her seizure control was sub optimal even prior to admission, it may be reasonable to add vimpat to her regimen, however I am concerned that the vimpat may be causing nausea and be responsible for her anorexia. I think it would be reasonable to optimize her other two AEDs and hold on the vimpat for now.   Of note, she has a seizure once every few days at baseline.   Recommendations: 1) Continue keppra, depakote 2) D/c vimpat  Ritta SlotMcNeill Zamzam Whinery, MD [facility] [phone]  If 7pm- 7am, please page neurology on call as listed in AMION.

## 2014-06-13 NOTE — Progress Notes (Signed)
Patient has remained awake more today then yesterday; participated with her meal at breakfast and dinner; full supervision by staff and had to be fed.  High risk for aspiration continues.

## 2014-06-13 NOTE — Progress Notes (Signed)
Pt refusing to take PO meds.  MD notified with no new orders.

## 2014-06-13 NOTE — Evaluation (Signed)
Physical Therapy Evaluation Patient Details Name: Rachel Bush MRN: [id number] DOB: 02/05/1971 Today's Date: 06/13/2014   History of Present Illness  Rachel Bush is a 44 y.o. female, Nepalese speaking family, with history of seizures since birth, severe cognitive impairment, nonverbal at baseline, fully dependent on ADLs, left hemiparesis, [facility] on 06/08/14 with frequent seizures.   Clinical Impression  Pt admitted with above diagnosis. Pt currently with functional limitations due to the deficits listed below (see PT Problem List).  Pt will benefit from skilled PT to increase their independence and safety with mobility to allow discharge to the venue listed below.      Follow Up Recommendations No PT follow up    Equipment Recommendations  None recommended by PT (No recs, if family able [facility]    Recommendations for Other Services       Precautions / Restrictions Precautions Precautions: Fall      Mobility  Bed Mobility Overal bed mobility: Needs Assistance Bed Mobility: Supine to Sit;Sit to Supine     Supine to sit: Max assist Sit to supine: Max assist      Transfers Overall transfer level: Needs assistance Equipment used: None Transfers: Sit to/from Stand Sit to Stand: +2 physical assistance;Max assist         General transfer comment: Pt very tearful with stance. Unable to fully extend BLE and exhibiting trunk flexion. Family requested return to supine for pt comfort.  Ambulation/Gait                Stairs            Wheelchair Mobility    Modified Rankin (Stroke Patients Only)       Balance                                             Pertinent Vitals/Pain Pain Assessment: Faces Faces Pain Scale: Hurts even more Pain Location: back/L hip Pain Descriptors / Indicators: Crying;Grimacing Pain Intervention(s): Limited activity within patient's tolerance;Repositioned    Home Living  Family/patient expects to be discharged to:: Private residence Living Arrangements: Other relatives Available Help at Discharge: Family;Available 24 hours/day Type of Home: Apartment Home Access: Level entry     Home Layout: One level Home Equipment: Wheelchair - manual Additional Comments: [place] and [place].    Prior Function Level of Independence: Needs assistance   Gait / Transfers Assistance Needed: Unsure of mobility PTA.  Pt is nonverbal and family members are poor historians. Per chart, pt was total assist for ADLs. Family reports pt was able to walk short distances in home with a family member on each side.            Hand Dominance        Extremity/Trunk Assessment                         Communication   Communication: Expressive difficulties;Other (comment) (nonverbal)  Cognition Arousal/Alertness: Awake/alert Behavior During Therapy: Flat affect Overall Cognitive Status: History of cognitive impairments - at baseline                      General Comments      Exercises        Assessment/Plan  PT Assessment Patient needs continued PT services  PT Diagnosis Difficulty walking;Acute pain;Generalized weakness   PT Problem List Decreased strength;Decreased activity tolerance;Decreased mobility;Pain;Decreased safety awareness  PT Treatment Interventions Gait training;Functional mobility training;Therapeutic activities;Therapeutic exercise;Balance training;Patient/family education   PT Goals (Current goals can be found in the Care Plan section) Acute Rehab PT Goals Patient Stated Goal: not stated PT Goal Formulation: With family Time For Goal Achievement: 06/27/14 Potential to Achieve Goals: Fair    Frequency Min 3X/week   Barriers to discharge        Co-evaluation               End of Session Equipment Utilized During Treatment: Gait belt Activity Tolerance: Patient  limited by pain;Patient limited by fatigue Patient left: in bed;with family/visitor present;with call bell/phone within reach;with bed alarm set Nurse Communication: Mobility status         Time: 1610-9604 PT Time Calculation (min) (ACUTE ONLY): 16 min   Charges:   PT Evaluation $Initial PT Evaluation Tier I: 1 Procedure     PT G Codes:        Ilda Foil 06/13/2014, 9:53 AM

## 2014-06-13 NOTE — Progress Notes (Signed)
There is a "heat" rash on the patient's upper back; nonidentified on her chest, arms, or legs; patient is attempting to itch the area; will call the MD on call for a prn and continue to monitor.

## 2014-06-14 MED ORDER — LEVETIRACETAM 100 MG/ML PO SOLN
1500.0000 mg | Freq: Two times a day (BID) | ORAL | Status: DC
  Filled 2014-06-14 (×2): qty 15

## 2014-06-14 MED ORDER — LEVETIRACETAM 750 MG PO TABS
1500.0000 mg | ORAL_TABLET | Freq: Two times a day (BID) | ORAL | Status: DC
  Administered 2014-06-14: 1500 mg via ORAL
  Filled 2014-06-14: qty 2

## 2014-06-14 MED ORDER — LEVETIRACETAM 100 MG/ML PO SOLN
1500.0000 mg | Freq: Two times a day (BID) | ORAL | Status: DC
  Administered 2014-06-14 – 2014-06-15 (×2): 1500 mg via ORAL
  Filled 2014-06-14 (×3): qty 15

## 2014-06-14 MED ORDER — DIVALPROEX SODIUM 500 MG PO DR TAB
500.0000 mg | DELAYED_RELEASE_TABLET | Freq: Two times a day (BID) | ORAL | Status: DC
  Administered 2014-06-14: 500 mg via ORAL
  Filled 2014-06-14: qty 1

## 2014-06-14 MED ORDER — VALPROIC ACID 250 MG/5ML PO SYRP
500.0000 mg | ORAL_SOLUTION | Freq: Two times a day (BID) | ORAL | Status: DC
  Filled 2014-06-14 (×2): qty 10

## 2014-06-14 MED ORDER — VALPROIC ACID 250 MG/5ML PO SYRP
500.0000 mg | ORAL_SOLUTION | Freq: Two times a day (BID) | ORAL | Status: DC
  Administered 2014-06-14 – 2014-06-15 (×2): 500 mg via ORAL
  Filled 2014-06-14 (×3): qty 10

## 2014-06-14 NOTE — Progress Notes (Signed)
[place] TEAM 1 - Stepdown/ICU TEAM Progress Note  Rachel GaskinsDamanta Bush HYQ:[id number] DOB: 07/11/1970 DOA: 06/08/2014 PCP: No primary care provider on file.  Admit HPI / Brief Narrative: 44 year old [place] female with history of severe cognitive impairment (nonverbal, nonambulatory at baseline - fully dependent on ADLs), seizure disorder, and left hemiparesis who [facility].  History was obtained from the daughter-in-law who is able to communicate in [place], as well as via an interpreter. Starting the night prior to her admit the patient had multiple seizure episodes occurring every 5 minutes. Neurology was consulted, patient was given an additional dose of IV Keppra, IV Depakote and then Vimpat was started. According to Neurologist patient has clinical seizures without status epilepticus by EEG monitoring.   In ED CT head was negative for acute findings, lactic acid was elevated at  2.83, WBCs 22.1, and CXR and UA with no abnormalities.   3/2- pt with evidence of aspiration pneumonia, placed on IV Vanc/Zosyn.   HPI/Subjective: Patient continues to be somnolent, does not respond but has her eyes open, no seizures overnight    Assessment/Plan:  Sepsis secondary to L lung Aspiration pneumonia/pneumonitis  -pt with WBCs 20.8, leukocytosis improving 13.0 today, dense L lung infiltrates noted on CXR  Zosyn discontinued, continue Augmentin by mouth3 days  -Lactic acid normal at 1.8 -blood, sputum cultures unrevstable from a respiratory standpoint ABG within normal limits  Acute  hypoxic respiratory failure due to L lung aspiration pneumonitis/PNA -CXR 3/3 noted improvement in infilstable  Hypokalemia Replete, recheck tomorrow  Seizure disorder with status epilepticus -no further seizure activity appreciated over last 24hrs   continue Keppra, Depacon, when necessary Ativan   source for breakthrough seizure possibly yeast vaginitis PT eval no follow-up  recommended, speech therapy to make further recommendations Discharge being held because of poor by mouth intake Neurology has signed off  Yeast Vaginitis completed 3 days of Diflucan   Acute encephalopathy -mental status appears to be at baseline -CT head with no acute findings  Mild Hypokalemia -corrected  - Mg ok  Severe cognitive impairment and left hemiparesis -as per her baseline   Speech therapy recommends Dysphagia 1 (Puree);Thin liquid   Code Status: FULL Family Communication: Nephew at bedside Disposition Plan:  anticipate discharge tomorrow   Consultants: Neurology  Procedures: EEG 03/01: abnormal with findings consistent with moderately severe diffuse encephalopaty, as well as seizure disorder with a generalized seizure recorded during the study as well as what appears to be an epileptogenic focus involving the right hemisphere.  Antibiotics: Diflucan 3/2 > Zosyn 3/2 >  DVT prophylaxis: SQ Lovenox  Objective: Blood pressure 96/48, pulse 57, temperature 98.8 F (37.1 C), temperature source Oral, resp. rate 20, height 4\' 10"  (1.473 m), weight 42.2 kg (93 lb 0.6 oz), SpO2 99 %.  Intake/Output Summary (Last 24 hours) at 06/14/14 1240 Last data filed at 06/13/14 1810  Gross per 24 hour  Intake     90 ml  Output   1500 ml  Net  -1410 ml   Exam: General: cachectic female, no acute resp distress on RA Lungs: air movement noted equally th/o L and R lung fields - no wheeze  Cardiovascular: Regular rate and rhythm without murmur gallop or rub normal S1 and S2 Abdomen: Nontender, nondistended, soft, bowel sounds positive, no rebound, no ascites, no appreciable mass Extremities: No significant cyanosis, clubbing, or edema bilateral lower extremities  Data Reviewed: Basic Metabolic Panel:  Recent Labs Lab 06/08/14 1002 06/09/14 0314 06/10/14 0250  06/10/14 0845 06/11/14 0255 06/13/14 0544  NA 137 136 134*  --  139 135  K 4.0 3.3* 3.0*  --  3.6 3.3*    CL 106 108 103  --  111 102  CO2 --  21 27  GLUCOSE 101* 87 128*  --  138* 94  BUN 23 11 <5*  --  <5* <5*  CREATININE 1.01 0.54 0.57  --  0.44* 0.53  CALCIUM 9.7 7.9* 7.9*  --  8.0* 8.2*  MG 1.8  --   --  1.9  --   --    Liver Function Tests:  Recent Labs Lab 06/08/14 1002 06/10/14 0250 06/13/14 0544  AST ALT ALKPHOS 83 62 79  BILITOT 1.2 1.1 0.4  PROT 7.9 5.6* 6.1  ALBUMIN 4.2 2.4* 2.3*   CBC:  Recent Labs Lab 06/08/14 1002 06/09/14 0314 06/10/14 0250 06/11/14 0255 06/12/14 0350  WBC 22.1* 20.8* 20.8* 19.7* 13.0*  NEUTROABS 20.0*  --   --   --   --   HGB 13.1 11.5* 11.6* 10.7* 10.4*  HCT 39.0 34.9* 34.3* 32.3* 30.5*  MCV 89.2 90.6 89.8 90.7 88.7  PLT 280 233 204 210 219    CBG:  Recent Labs Lab 06/10/14 1642 06/10/14 2357 06/11/14 0737 06/11/14 1522 06/13/14 0107  GLUCAP 102* 115* 146* 84 88    Recent Results (from the past 240 hour(s))  MRSA PCR Screening     Status: None   Collection Time: 06/08/14  4:11 PM  Result Value Ref Range Status   MRSA by PCR NEGATIVE NEGATIVE Final    Comment:        The GeneXpert MRSA Assay (FDA approved for NASAL specimens only), is one component of a comprehensive MRSA colonization surveillance program. It is not intended to diagnose MRSA infection nor to guide or monitor treatment for MRSA infections.   Culture, blood (routine x 2)     Status: None (Preliminary result)   Collection Time: 06/09/14  9:40 AM  Result Value Ref Range Status   Specimen Description BLOOD HAND LEFT  Final   Special Requests BOTTLES DRAWN AEROBIC ONLY 2CC  Final   Culture   Final           BLOOD CULTURE RECEIVED NO GROWTH TO DATE CULTURE WILL BE HELD FOR 5 DAYS BEFORE ISSUING A FINAL NEGATIVE REPORT Performed at [facility]    Report Status PENDING  Incomplete  Culture, blood (routine x 2)     Status: None (Preliminary result)   Collection Time: 06/09/14 11:10 AM  Result Value Ref Range  Status   Specimen Description BLOOD HAND LEFT  Final   Special Requests BOTTLES DRAWN AEROBIC ONLY 5CC  Final   Culture   Final           BLOOD CULTURE RECEIVED NO GROWTH TO DATE CULTURE WILL BE HELD FOR 5 DAYS BEFORE ISSUING A FINAL NEGATIVE REPORT Performed at [facility]    Report Status PENDING  Incomplete  Culture, Urine     Status: None   Collection Time: 06/09/14  1:02 PM  Result Value Ref Range Status   Specimen Description URINE, CATHETERIZED  Final   Special Requests NONE  Final   Colony Count NO GROWTH Performed at [facility]   Final   Culture NO GROWTH Performed at [facility]   Final   Report Status 06/10/2014 FINAL  Final  Studies:  Recent x-ray studies have been reviewed in detail by the Attending Physician  Scheduled Meds:  Scheduled Meds: . amoxicillin-clavulanate  1 tablet Oral Q12H  . enoxaparin (LOVENOX) injection  30 mg Subcutaneous Q24H  . levETIRAcetam  1,500 mg Per Tube BID  . sodium chloride  3 mL Intravenous Q12H  . Valproic Acid  500 mg Per Tube BID    Time spent on care of this patient: 35 mins  Richarda Overlie, MD [facility] Consults/Admissions - Flow Manager [phone] Office  [phone]  Contact MD directly via text page:      [email or web address]      password [facility]  06/14/2014, 12:40 PM   LOS: 6 days

## 2014-06-14 NOTE — Progress Notes (Signed)
PT Cancellation Note  Patient Details Name: Rachel Bush MRN: [id number] DOB: 06/30/1970   Cancelled Treatment:    Reason Treat Not Completed: Pt and family member sound asleep and not awakening with knocking or calling their name. Noted discharge has been postponed due to ?po intake. Also noted pt has been total care PTA and non-ambulatory, therefore uncertain if pt has had a functional decline. Will clarify with family when able to participate.   Connee Ikner 06/14/2014, 3:00 PM  Pager [phone]

## 2014-06-14 NOTE — Progress Notes (Signed)
Speech Language Pathology Treatment: Dysphagia  Patient Details Name: Rachel GaskinsDamanta Bush MRN: [id number] DOB: 05/21/1970 Today's Date: 06/14/2014 Time: [id number] SLP Time Calculation (min) (ACUTE ONLY): 24 min  Assessment / Plan / Recommendation Clinical Impression  Pt seen for f/u dysphagia treatment. She was sleepy but easily awakened for PO intake. SLP administered trials of water by spoon, cup, and straw with cough noted x1 after particularly large straw sip. Suspected premature spillage in the setting of decreased awareness and oral control. With initial trial of soft solid, pt required Mod visual/tactile cueing to initiate mastication. After this trial, all further boluses were masticated more spontaneously with liquid washes provided by SLP in order to adequately clear oral cavity. Per discussion with RN, intake continues to be a concern - would recommend advancing diet to Dys 2 and thin liquids by cup/straw with continued full staff supervision to utilize recommending strategies and aspiration precautions to reduce the risk of aspiration. Will continue to follow.   HPI HPI: 44 year old female admitted 06/08/14 due to intractible seizures. PMH significant for cerebral palsy with severe cognitive impairment, nonverbal at baseline, left hemi, total care, seizures. CT negative for acute findings. Initial CXR was clear, CXR 06/09/14 revealed LLL infiltrate.   Pertinent Vitals Pain Assessment: Faces Faces Pain Scale: No hurt  SLP Plan  Continue with current plan of care    Recommendations Diet recommendations: Dysphagia 2 (fine chop);Thin liquid Liquids provided via: Cup;Straw Medication Administration: Crushed with puree Supervision: Staff to assist with self feeding;Full supervision/cueing for compensatory strategies Compensations: Slow rate;Small sips/bites;Check for pocketing;Follow solids with liquid (watch for oral holding) Postural Changes and/or Swallow Maneuvers: Seated upright 90 degrees        Oral Care Recommendations: Oral care BID Follow up Recommendations: Home health SLP Plan: Continue with current plan of care    Rachel Bush, M.A. CCC-SLP [phone]  Rachel Bush, Rachel Bush 06/14/2014, 10:46 AM

## 2014-06-14 NOTE — Progress Notes (Signed)
Subjective: No further seizures.  More awake today per nursing staff. Refusing PO medications.   Objective: Current vital signs: BP 96/48 mmHg  Pulse 57  Temp(Src) 98.8 F (37.1 C) (Oral)  Resp 20  Ht 4\' 10"  (1.473 m)  Wt 42.2 kg (93 lb 0.6 oz)  BMI 19.45 kg/m2  SpO2 99%  LMP  (LMP Unknown) Vital signs in last 24 hours: Temp:  [97.7 F (36.5 C)-98.8 F (37.1 C)] 98.8 F (37.1 C) (03/07 0956) Pulse Rate:  [55-78] 57 (03/07 0956) Resp:  [20] 20 (03/07 0956) BP: (93-117)/(48-75) 96/48 mmHg (03/07 0956) SpO2:  [95 %-100 %] 99 % (03/07 0956)  Intake/Output from previous day: 03/06 0701 - 03/07 0700 In: 210 [P.O.:210] Out: 1500 [Urine:1500] Intake/Output this shift:   Nutritional status: DIET - DYS 1  Neurologic Exam: General: Mental Status: Alert, follows my movements in room,  Cranial Nerves: II: blinks to threat bilaterally, pupils equal, round, reactive to light and accommodation III,IV, VI: ptosis not present, extra-ocular motions intact bilaterally V,VII: face symmetric, facial light touch sensation normal bilaterally VIII: hearing normal bilaterally    Motor: Moving all extremities antigravity and equally Sensory: withdrawal  Deep Tendon Reflexes:  2+ throughout UE and KJ  Plantars: Right: downgoing   Left: downgoing   Lab Results: Basic Metabolic Panel:  Recent Labs Lab 06/08/14 1002 06/09/14 0314 06/10/14 0250 06/10/14 0845 06/11/14 0255 06/13/14 0544  NA 137 136 134*  --  139 135  K 4.0 3.3* 3.0*  --  3.6 3.3*  CL 106 108 103  --  111 102  CO2 19 22 23   --  21 27  GLUCOSE 101* 87 128*  --  138* 94  BUN 23 11 <5*  --  <5* <5*  CREATININE 1.01 0.54 0.57  --  0.44* 0.53  CALCIUM 9.7 7.9* 7.9*  --  8.0* 8.2*  MG 1.8  --   --  1.9  --   --     Liver Function Tests:  Recent Labs Lab 06/08/14 1002 06/10/14 0250 06/13/14 0544  AST 31 31 19   ALT 19 14 13   ALKPHOS 83 62 79  BILITOT 1.2 1.1 0.4  PROT 7.9 5.6* 6.1  ALBUMIN 4.2 2.4* 2.3*    No results for input(s): LIPASE, AMYLASE in the last 168 hours.  Recent Labs Lab 06/09/14 1010  AMMONIA 31    CBC:  Recent Labs Lab 06/08/14 1002 06/09/14 0314 06/10/14 0250 06/11/14 0255 06/12/14 0350  WBC 22.1* 20.8* 20.8* 19.7* 13.0*  NEUTROABS 20.0*  --   --   --   --   HGB 13.1 11.5* 11.6* 10.7* 10.4*  HCT 39.0 34.9* 34.3* 32.3* 30.5*  MCV 89.2 90.6 89.8 90.7 88.7  PLT 280 233 204 210 219    Cardiac Enzymes: No results for input(s): CKTOTAL, CKMB, CKMBINDEX, TROPONINI in the last 168 hours.  Lipid Panel: No results for input(s): CHOL, TRIG, HDL, CHOLHDL, VLDL, LDLCALC in the last 168 hours.  CBG:  Recent Labs Lab 06/10/14 1642 06/10/14 2357 06/11/14 0737 06/11/14 1522 06/13/14 0107  GLUCAP 102* 115* 146* 84 88    Microbiology: Results for orders placed or performed during the hospital encounter of 06/08/14  MRSA PCR Screening     Status: None   Collection Time: 06/08/14  4:11 PM  Result Value Ref Range Status   MRSA by PCR NEGATIVE NEGATIVE Final    Comment:        The GeneXpert MRSA Assay (FDA approved for NASAL  specimens only), is one component of a comprehensive MRSA colonization surveillance program. It is not intended to diagnose MRSA infection nor to guide or monitor treatment for MRSA infections.   Culture, blood (routine x 2)     Status: None (Preliminary result)   Collection Time: 06/09/14  9:40 AM  Result Value Ref Range Status   Specimen Description BLOOD HAND LEFT  Final   Special Requests BOTTLES DRAWN AEROBIC ONLY 2CC  Final   Culture   Final           BLOOD CULTURE RECEIVED NO GROWTH TO DATE CULTURE WILL BE HELD FOR 5 DAYS BEFORE ISSUING A FINAL NEGATIVE REPORT Performed at [facility]    Report Status PENDING  Incomplete  Culture, blood (routine x 2)     Status: None (Preliminary result)   Collection Time: 06/09/14 11:10 AM  Result Value Ref Range Status   Specimen Description BLOOD HAND LEFT  Final   Special  Requests BOTTLES DRAWN AEROBIC ONLY 5CC  Final   Culture   Final           BLOOD CULTURE RECEIVED NO GROWTH TO DATE CULTURE WILL BE HELD FOR 5 DAYS BEFORE ISSUING A FINAL NEGATIVE REPORT Performed at [facility]    Report Status PENDING  Incomplete  Culture, Urine     Status: None   Collection Time: 06/09/14  1:02 PM  Result Value Ref Range Status   Specimen Description URINE, CATHETERIZED  Final   Special Requests NONE  Final   Colony Count NO GROWTH Performed at [facility]   Final   Culture NO GROWTH Performed at [facility]   Final   Report Status 06/10/2014 FINAL  Final    Coagulation Studies: No results for input(s): LABPROT, INR in the last 72 hours.  Imaging: No results found.  Medications:  Scheduled: . amoxicillin-clavulanate  1 tablet Oral Q12H  . divalproex  500 mg Oral Q12H  . enoxaparin (LOVENOX) injection  30 mg Subcutaneous Q24H  . levETIRAcetam  1,500 mg Oral BID  . sodium chloride  3 mL Intravenous Q12H    Assessment/Plan: 44 yo F with h/o seizure disorder and unclear neurological condition present since childhood requiring assistence with most ADLs. She presented in status epilepticus likely secondary to vaginitis. She has much improved. She has been doing well on Keppra, Depakote and Vimpat but then noted to have nausea.  Vimpat was Stopped due to possibility it was causing nausea and anorexia.  No further seizures on Depakote and Keppra.   Of note, she has a seizure once every few days at baseline.   Recommendations: 1) Continue keppra, depakote  Neurology will S/O   Felicie Morn PA-C [facility] [phone]  06/14/2014, 10:06 AM

## 2014-06-14 NOTE — Progress Notes (Signed)
CNA alerted me that the B/P for Rachel Bush was 96/48, Dr. Susie CassetteAbrol was sent a text message notifying her, however the patients blood pressures runs in the low 100's to high 90's

## 2014-06-15 LAB — CULTURE, BLOOD (ROUTINE X 2)
CULTURE: NO GROWTH
Culture: NO GROWTH

## 2014-06-15 LAB — COMPREHENSIVE METABOLIC PANEL
ALK PHOS: 142 U/L — AB (ref 39–117)
ALT: 40 U/L — AB (ref 0–35)
AST: 42 U/L — AB (ref 0–37)
Albumin: 2.6 g/dL — ABNORMAL LOW (ref 3.5–5.2)
Anion gap: 9 (ref 5–15)
BUN: <5 mg/dL — ABNORMAL LOW (ref 6–23)
CHLORIDE: 105 mmol/L (ref 96–112)
CO2: 23 mmol/L (ref 19–32)
Calcium: 9.1 mg/dL (ref 8.4–10.5)
Creatinine, Ser: 0.48 mg/dL — ABNORMAL LOW (ref 0.50–1.10)
GLUCOSE: 92 mg/dL (ref 70–99)
POTASSIUM: 4 mmol/L (ref 3.5–5.1)
Sodium: 137 mmol/L (ref 135–145)
Total Bilirubin: 0.3 mg/dL (ref 0.3–1.2)
Total Protein: 6.4 g/dL (ref 6.0–8.3)

## 2014-06-15 LAB — CBC
HEMATOCRIT: 34.8 % — AB (ref 36.0–46.0)
Hemoglobin: 11.6 g/dL — ABNORMAL LOW (ref 12.0–15.0)
MCH: 30.1 pg (ref 26.0–34.0)
MCHC: 33.3 g/dL (ref 30.0–36.0)
MCV: 90.2 fL (ref 78.0–100.0)
Platelets: 392 10*3/uL (ref 150–400)
RBC: 3.86 MIL/uL — ABNORMAL LOW (ref 3.87–5.11)
RDW: 14 % (ref 11.5–15.5)
WBC: 8.9 10*3/uL (ref 4.0–10.5)

## 2014-06-15 MED ORDER — VALPROIC ACID 250 MG/5ML PO SYRP: 500.0000 mg | ORAL_SOLUTION | Freq: Two times a day (BID) | ORAL | Status: DC

## 2014-06-15 MED ORDER — ACETAMINOPHEN 325 MG PO TABS: 650.0000 mg | ORAL_TABLET | Freq: Four times a day (QID) | ORAL | Status: DC | PRN

## 2014-06-15 MED ORDER — LEVETIRACETAM 100 MG/ML PO SOLN: 1500.0000 mg | Freq: Two times a day (BID) | ORAL | Status: DC

## 2014-06-15 MED ORDER — ACETAMINOPHEN 650 MG RE SUPP
650.0000 mg | Freq: Four times a day (QID) | RECTAL | Status: DC | PRN
Start: 2014-06-15 — End: 2014-08-16

## 2014-06-15 MED ORDER — POTASSIUM CHLORIDE ER 10 MEQ PO TBCR: 20.0000 meq | EXTENDED_RELEASE_TABLET | Freq: Every day | ORAL | Status: DC

## 2014-06-15 MED ORDER — AMOXICILLIN-POT CLAVULANATE 875-125 MG PO TABS: 1.0000 | ORAL_TABLET | Freq: Two times a day (BID) | ORAL | Status: DC

## 2014-06-15 NOTE — Discharge Instructions (Signed)
Seizure, Adult A seizure is abnormal electrical activity in the brain. Seizures usually last from 30 seconds to 2 minutes. There are various types of seizures. Before a seizure, you may have a warning sensation (aura) that a seizure is about to occur. An aura may include the following symptoms:   Fear or anxiety.  Nausea.  Feeling like the room is spinning (vertigo).  Vision changes, such as seeing flashing lights or spots. Common symptoms during a seizure include:  A change in attention or behavior (altered mental status).  Convulsions with rhythmic jerking movements.  Drooling.  Rapid eye movements.  Grunting.  Loss of bladder and bowel control.  Bitter taste in the mouth.  Tongue biting. After a seizure, you may feel confused and sleepy. You may also have an injury resulting from convulsions during the seizure. HOME CARE INSTRUCTIONS   If you are given medicines, take them exactly as prescribed by your health care provider.  Keep all follow-up appointments as directed by your health care provider.  Do not swim or drive or engage in risky activity during which a seizure could cause further injury to you or others until your health care provider says it is OK.  Get adequate rest.  Teach friends and family what to do if you have a seizure. They should:  Lay you on the ground to prevent a fall.  Put a cushion under your head.  Loosen any tight clothing around your neck.  Turn you on your side. If vomiting occurs, this helps keep your airway clear.  Stay with you until you recover.  Know whether or not you need emergency care. SEEK IMMEDIATE MEDICAL CARE IF:  The seizure lasts longer than 5 minutes.  The seizure is severe or you do not wake up immediately after the seizure.  You have an altered mental status after the seizure.  You are having more frequent or worsening seizures. Someone should drive you to the emergency department or call local emergency  services (911 in [place] MAKE SURE YOU:  Understand these instructions.  Will watch your condition.  Will get help right away if you are not doing well or get worse. Document Released: 03/23/2000 Document Revised: 01/14/2013 Document Reviewed: 11/05/2012 [facility] Patient Information 2015 [employer] [place]. This information is not intended to replace advice given to you by your health care provider. Make sure you discuss any questions you have with your health care provider.   Dysphagia 2 (fine chop);Thin liquid

## 2014-06-15 NOTE — Progress Notes (Signed)
Family members do not want to wait on wheelchair and wants to go by the [facility] and pick up the wheelchair for home; A copy of the order and insurance information given to the family member to pick up the wheelchair - also the address/ telephone number for [facility]; [name] RN,BSN,MHA [phone]

## 2014-06-15 NOTE — Progress Notes (Signed)
Physical Therapy Treatment Patient Details Name: Rachel Bush MRN: [id number] DOB: [birth year] Today's Date: 06/15/2014    History of Present Illness Rachel Bush is a 44 y.o. Nepalese speaking female, with history of seizures since birth, cognitive impairment, fully dependent on ADLs, left hemiparesis, presented to the [facility] ED on 06/08/14 with frequent seizures.     PT Comments    Pt's nephew present and reports pt has many family members that help care for her. He reports she is normally verbal "like you or me" and that she has spoken to him (but it is limited). He reports that her mobility and need for assistance changes depending on her interest in performing the task. He denies any further therapy needs.   Follow Up Recommendations  No PT follow up;Supervision/Assistance - 24 hour (Nephew reports they have cared for pt for years with no PT needs)     Financial risk analyst; Wheelchair cushion (for family to take pt to appointments and church)    Recommendations for Other Services       Precautions / Restrictions Precautions Precautions: Fall    Mobility  Bed Mobility Overal bed mobility: Needs Assistance Bed Mobility: Supine to Sit;Sit to Supine     Supine to sit: Mod assist;HOB elevated Sit to supine: Supervision   General bed mobility comments: pt moved supine to long-sitting modified independent; required tactile cues and mod assist to then scoot to put feet on floor (pt mildly resisting); slowly able to move sit to supine herself demonstrating good use of UEs to scoot her body up onto bed  Transfers Overall transfer level: Needs assistance Equipment used: 2 person hand held assist Transfers: Sit to/from Stand Sit to Stand: Max assist;+2 physical assistance         General transfer comment: Pt required tactile cues to initiate standing; poor use of Lt side initially with max assist to obtain upright standing; attempted transfer onto toilet with pt  achieving 1/2 standing and would not fully sit down with return to upright standing with min assist  Ambulation/Gait Ambulation/Gait assistance: +2 physical assistance;+2 safety/equipment;Mod assist;Min assist Ambulation Distance (Feet): 30 Feet Assistive device: 2 person hand held assist Gait Pattern/deviations: Step-through pattern;Decreased stride length;Narrow base of support Gait velocity: very slow   General Gait Details: required facilitation to initiate weight shifts and advancing legs at first; progressed to independently weight shifting and advancing her legs; bil HHA to guide her with min assist by end of walk   Stairs            Wheelchair Mobility    Modified Rankin (Stroke Patients Only)       Balance Overall balance assessment: Needs assistance Sitting-balance support: No upper extremity supported;Feet unsupported Sitting balance-Leahy Scale: Fair     Standing balance support: Bilateral upper extremity supported Standing balance-Leahy Scale: Poor Standing balance comment: varied max assist to minguard                    Cognition Arousal/Alertness: Awake/alert Behavior During Therapy: Flat affect Overall Cognitive Status: History of cognitive impairments - at baseline (at her most recent baseline per nephew)                      Exercises      General Comments        Pertinent Vitals/Pain Pain Assessment: Faces Faces Pain Scale: No hurt    Home Living Family/patient expects to be discharged to:: Private residence Living Arrangements: Other relatives (  sister-in-law, nieces, nephews) Available Help at Discharge: Family;Available 24 hours/day Type of Home: Apartment Home Access: Level entry   Home Layout: One level Home Equipment:  (wheelchair in [place])      Prior Function Level of Independence: Needs assistance  Gait / Transfers Assistance Needed: nephew reports her recent baseline is requires assist to walk; he states it  varies based on pt interest; they use a wheelchair to take her out (to MD appts, to church)       PT Goals (current goals can now be found in the care plan section) Acute Rehab PT Goals Patient Stated Goal: nephew reports goal is to get pt home  Progress towards PT goals: Goals met/education completed, patient discharged from PT    Frequency       PT Plan Current plan remains appropriate    Co-evaluation PT/OT/SLP Co-Evaluation/Treatment: Yes Reason for Co-Treatment: Complexity of the patient's impairments (multi-system involvement);Other (comment) (assist of family to interpret) PT goals addressed during session: Mobility/safety with mobility;Balance       End of Session Equipment Utilized During Treatment: Gait belt Activity Tolerance: Patient tolerated treatment well Patient left: in bed;with bed alarm set;with family/visitor present     Time: 1130-1157 PT Time Calculation (min) (ACUTE ONLY): 27 min  Charges:  $Gait Training: 8-22 mins                    G Codes:      Rachel Bush 22-Jun-2014, 12:24 PM Pager [phone]

## 2014-06-15 NOTE — Progress Notes (Signed)
Pt d/c to home by ambulance. Assessment stable. Prescriptions given. Family verbalizes understanding that pt has a follow up [facility]th [facility]. Family verbalizes understanding that they need to take prescriptions to a pharmacy and have those filled today and that pt needs these medications today.

## 2014-06-15 NOTE — Progress Notes (Signed)
Talked to nephew in room with patient about follow up care; [place] [facility] [place] to live with family; she applied for Medicaid 15 days ago; [facility], [facility], Miranda with [facility] called for arrangements, wheelchair ordered; [facility], [facility] at 9 am; CM asked patient's nephew will they be able to get her prescriptions filled at discharge - he stated yes, her sister or brother in law will get her prescriptions filled; CM asked the nephew to contact the sister or brother in law to be for sure; CM expressed the importance of getting the prescriptions filled and taking her medicationAlexis Bush; B Joshual Terrio RN,BSN,MHA [phone]

## 2014-06-15 NOTE — Discharge Summary (Signed)
Physician Discharge Summary  Alondria Mousseau MRN: [id number] DOB/AGE: 01/02/1971 44 y.o.  PCP: No primary care provider on file.   Admit date: 06/08/2014 Discharge date: 06/15/2014  Discharge Diagnoses:   Principal Problem:   Status epilepticus Active Problems:   Cognitive impairment   Left hemiparesis   Seizure disorder, generalized convulsive, intractable   Aspiration pneumonia due to food (regurgitated)   Sepsis due to pneumonia   Acute respiratory failure with hypoxia   Seizure disorder   Vaginal candidiasis   Hypokalemia   Hyponatremia   Acute encephalopathy   Follow-up recommendations Follow-up with PCP in 5-7 days Dysphagia 2 (fine chop);Thin liquid    Medication List    STOP taking these medications        divalproex 500 MG DR tablet  Commonly known as:  DEPAKOTE     escitalopram 10 MG tablet  Commonly known as:  LEXAPRO     levETIRAcetam 500 MG tablet  Commonly known as:  KEPPRA  Replaced by:  levETIRAcetam 100 MG/ML solution      TAKE these medications        acetaminophen 650 MG suppository  Commonly known as:  TYLENOL  Place 1 suppository (650 mg total) rectally every 6 (six) hours as needed for mild pain (or Fever >/= 101).     acetaminophen 325 MG tablet  Commonly known as:  TYLENOL  Take 2 tablets (650 mg total) by mouth every 6 (six) hours as needed for mild pain, fever or headache.     amoxicillin-clavulanate 875-125 MG per tablet  Commonly known as:  AUGMENTIN  Take 1 tablet by mouth every 12 (twelve) hours.     levETIRAcetam 100 MG/ML solution  Commonly known as:  KEPPRA  Take 15 mLs (1,500 mg total) by mouth 2 (two) times daily.     potassium chloride 10 MEQ tablet  Commonly known as:  K-DUR  Take 2 tablets (20 mEq total) by mouth daily.     Valproic Acid 250 MG/5ML Syrp syrup  Commonly known as:  DEPAKENE  Take 10 mLs (500 mg total) by mouth 2 (two) times daily.        Discharge Condition: Stable   Disposition: Final  discharge disposition not confirmed   Consults:  Neurology    Significant Diagnostic Studies: Ct Head Wo Contrast  06/08/2014   CLINICAL DATA:  Seizures starting last night  EXAM: CT HEAD WITHOUT CONTRAST  TECHNIQUE: Contiguous axial images were obtained from the base of the skull through the vertex without intravenous contrast.  COMPARISON:  None.  FINDINGS: No skull fracture is noted. Mucosal thickening of right maxillary sinus. The mastoid air cells are unremarkable.  No intracranial hemorrhage, mass effect or midline shift. No acute cortical infarction. No mass lesion is noted on this unenhanced scan. The gray and white-matter differentiation is preserved.  IMPRESSION: No acute intracranial abnormality. Mucosal thickening right maxillary sinus   Electronically Signed   By: Lahoma Crocker M.D.   On: 06/08/2014 12:52   Dg Chest Port 1 View  06/10/2014   CLINICAL DATA:  Follow-up of pneumonia  EXAM: PORTABLE CHEST - 1 VIEW  COMPARISON:  Portable chest x-ray of June 09, 2014  FINDINGS: The lungs are well-expanded. Patchy alveolar infiltrates persist in the mid and lower left lung but have become less conspicuous since yesterday's study. The left hemidiaphragm is partially visible today. There is a small left pleural effusion. The heart and pulmonary vascularity are normal.  IMPRESSION: Improving atelectasis and/or pneumonia  on the left.   Electronically Signed   By: David  Martinique   On: 06/10/2014 07:27   Dg Chest Port 1 View  06/09/2014   CLINICAL DATA:  Recent aspiration  EXAM: PORTABLE CHEST - 1 VIEW  COMPARISON:  06/08/2014  FINDINGS: Cardiac shadow is stable. Significant increased left-sided infiltrate is noted consistent with the given clinical history. The right lung remains clear. No bony abnormality is noted.  IMPRESSION: Increase left-sided infiltrate consistent with the given clinical history of aspiration particularly in the left lower lobe.   Electronically Signed   By: Inez Catalina M.D.   On:  06/09/2014 10:02   Dg Chest Port 1 View  06/08/2014   CLINICAL DATA:  Seizures and unresponsive  EXAM: PORTABLE CHEST - 1 VIEW  COMPARISON:  None.  FINDINGS: The heart size and mediastinal contours are within normal limits. Both lungs are clear. The visualized skeletal structures are unremarkable. Bilateral nipple shadows are noted over the bases.  IMPRESSION: No active disease.   Electronically Signed   By: Inez Catalina M.D.   On: 06/08/2014 11:51      Microbiology: Recent Results (from the past 240 hour(s))  MRSA PCR Screening     Status: None   Collection Time: 06/08/14  4:11 PM  Result Value Ref Range Status   MRSA by PCR NEGATIVE NEGATIVE Final    Comment:        The GeneXpert MRSA Assay (FDA approved for NASAL specimens only), is one component of a comprehensive MRSA colonization surveillance program. It is not intended to diagnose MRSA infection nor to guide or monitor treatment for MRSA infections.   Culture, blood (routine x 2)     Status: None   Collection Time: 06/09/14  9:40 AM  Result Value Ref Range Status   Specimen Description BLOOD HAND LEFT  Final   Special Requests BOTTLES DRAWN AEROBIC ONLY 2CC  Final   Culture   Final    NO GROWTH 5 DAYS Performed at [facility]    Report Status 06/15/2014 FINAL  Final  Culture, blood (routine x 2)     Status: None   Collection Time: 06/09/14 11:10 AM  Result Value Ref Range Status   Specimen Description BLOOD HAND LEFT  Final   Special Requests BOTTLES DRAWN AEROBIC ONLY 5CC  Final   Culture   Final    NO GROWTH 5 DAYS Performed at [facility]    Report Status 06/15/2014 FINAL  Final  Culture, Urine     Status: None   Collection Time: 06/09/14  1:02 PM  Result Value Ref Range Status   Specimen Description URINE, CATHETERIZED  Final   Special Requests NONE  Final   Colony Count NO GROWTH Performed at [facility]   Final   Culture NO GROWTH Performed at [facility]   Final    Report Status 06/10/2014 FINAL  Final     Labs: Results for orders placed or performed during the hospital encounter of 06/08/14 (from the past 48 hour(s))  Comprehensive metabolic panel     Status: Abnormal   Collection Time: 06/15/14  6:47 AM  Result Value Ref Range   Sodium 137 135 - 145 mmol/L   Potassium 4.0 3.5 - 5.1 mmol/L   Chloride 105 96 - 112 mmol/L   CO2 23 19 - 32 mmol/L   Glucose, Bld 92 70 - 99 mg/dL   BUN <5 (L) 6 - 23 mg/dL   Creatinine, Ser  0.48 (L) 0.50 - 1.10 mg/dL   Calcium 9.1 8.4 - 10.5 mg/dL   Total Protein 6.4 6.0 - 8.3 g/dL   Albumin 2.6 (L) 3.5 - 5.2 g/dL   AST 42 (H) 0 - 37 U/L   ALT 40 (H) 0 - 35 U/L   Alkaline Phosphatase 142 (H) 39 - 117 U/L   Total Bilirubin 0.3 0.3 - 1.2 mg/dL   GFR calc non Af Amer >90 >90 mL/min   GFR calc Af Amer >90 >90 mL/min    Comment: (NOTE) The eGFR has been calculated using the CKD EPI equation. This calculation has not been validated in all clinical situations. eGFR's persistently <90 mL/min signify possible Chronic Kidney Disease.    Anion gap 9 5 - 15  CBC     Status: Abnormal   Collection Time: 06/15/14  6:47 AM  Result Value Ref Range   WBC 8.9 4.0 - 10.5 K/uL   RBC 3.86 (L) 3.87 - 5.11 MIL/uL   Hemoglobin 11.6 (L) 12.0 - 15.0 g/dL   HCT 34.8 (L) 36.0 - 46.0 %   MCV 90.2 78.0 - 100.0 fL   MCH 30.1 26.0 - 34.0 pg   MCHC 33.3 30.0 - 36.0 g/dL   RDW 14.0 11.5 - 15.5 %   Platelets 392 150 - 400 K/uL     HPI :44 year old [place] female with history of severe cognitive impairment (nonverbal, nonambulatory at baseline - fully dependent on ADLs), seizure disorder, and left hemiparesis who presented to [facility] with frequent seizures. History was obtained from the daughter-in-law who is able to communicate in [place], as well as via an interpreter. Starting the night prior to her admit the patient had multiple seizure episodes occurring every 5 minutes. Neurology was consulted, patient was given an additional dose  of IV Keppra, IV Depakote and then Vimpat was started. According to Neurologist patient has clinical seizures without status epilepticus by EEG monitoring.   In ED CT head was negative for acute findings, lactic acid was elevated at 2.83, WBCs 22.1, and CXR and UA with no abnormalities.   3/2- pt with evidence of aspiration pneumonia, placed on IV Vanc/Zosyn.    HOSPITAL COURSE:  Sepsis secondary to L lung Aspiration pneumonia/pneumonitis  Leukocytosis is improved from 20.8-8.9, dense L lung infiltrates noted on CXR Initially started onZosyn now discontinued, switched to Augmentin by mouth3 days  -Lactic acid normal at 1.8 -blood, sputum cultures no growth so far ABG within normal limits  Acute hypoxic respiratory failure due to L lung aspiration pneumonitis/PNA -CXR 3/3 noted improvement in infilstable  Hypokalemia Repleted Recheck CBC CMP in 1 week  Seizure disorder with status epilepticus -no further seizure activity appreciated over last 72 hours continue Keppra, Depacon, source for breakthrough seizure possibly yeast vaginitis PT eval recommended 24-hour supervision with wheelchair,,  speech therapy recommended Dysphagia 2 (fine chop);Thin liquidDischarge being held because of poor by mouth intake Neurology has signed off  Yeast Vaginitis completed 3 days of Diflucan   Acute encephalopathy -mental status appears to be at baseline, she is total care at baseline -CT head with no acute findings  Mild Hypokalemia -corrected - Mg ok  Severe cognitive impairment and left hemiparesis -as per her baseline   Speech therapy recommends Dysphagia 1 (Puree);Thin liquid   Code Status: FULL Family Communication: Nephew at bedside   Consultants: Neurology  Procedures: EEG 03/01: abnormal with findings consistent with moderately severe diffuse encephalopaty, as well as seizure disorder with a generalized seizure recorded during the  study as well as what appears to  be an epileptogenic focus involving the right hemisphere.    Discharge Exam:  Blood pressure 100/58, pulse 66, temperature 97.8 F (36.6 C), temperature source Oral, resp. rate 20, height 4' 10"  (1.473 m), weight 42.2 kg (93 lb 0.6 oz), SpO2 99 %.  General: cachectic female, no acute resp distress on RA Lungs: air movement noted equally th/o L and R lung fields - no wheeze  Cardiovascular: Regular rate and rhythm without murmur gallop or rub normal S1 and S2 Abdomen: Nontender, nondistended, soft, bowel sounds positive, no rebound, no ascites, no appreciable mass Extremities: No significant cyanosis, clubbing, or edema bilateral lower extremities       Discharge Instructions    Diet - low sodium heart healthy    Complete by:  As directed      Increase activity slowly    Complete by:  As directed            Follow-up Information    Follow up with [facility]     On 06/18/2014.   Why:  at 9 am; please try to keep your apt or call to reschedule   Contact information:   [address] [place] [address] [phone]      Follow up with [facility].   Why:  they will provide your home health care at your home   Contact information:   [address] [place] [address] [phone]       Signed: Reyne Dumas 06/15/2014, 1:22 PM

## 2014-06-15 NOTE — Progress Notes (Signed)
Miranda with [facility] talked to pt/ family member - they refused all HHC services at this time; Information left in the room with [facility] telephone number if they changed their mind and [facility]; Alexis GoodellB Rhealyn Cullen RN,BSN,MHA [phone]

## 2014-06-15 NOTE — Progress Notes (Signed)
Family refuses ambulance stated they were taking too long. Family to transport home by car.

## 2014-06-15 NOTE — Progress Notes (Signed)
Occupational Therapy Treatment Patient Details Name: Rachel Bush MRN: [id number] DOB: 08/27/1970 Today's Date: 06/15/2014    History of present illness Rachel Bush is a 44 y.o. Nepalese speaking female, with history of seizures since birth, cognitive impairment, fully dependent on ADLs, left hemiparesis, presented to the [facility] ED on 06/08/14 with frequent seizures.    OT comments  Pt admitted with the above diagnoses and seen for acute OT evaluation. Pt's nephew present for session and reports pt is at baseline with functional mobility and ADLs. Pt has many family members who have been caring for pt for a few years. No further acute OT needs at this time.    Follow Up Recommendations  No OT follow up;Supervision/Assistance - 24 hour    Equipment Recommendations  None recommended by OT    Recommendations for Other Services      Precautions / Restrictions Precautions Precautions: Fall Restrictions Weight Bearing Restrictions: No       Mobility Bed Mobility Overal bed mobility: Needs Assistance Bed Mobility: Supine to Sit;Sit to Supine     Supine to sit: Mod assist;HOB elevated Sit to supine: Supervision   General bed mobility comments: assist to scoot forward to EOB with visual and verbal cues provided. Used BUE to advance self from sitting to long sitting to initiate coming EOB. Assist to come fully EOB.  Transfers Overall transfer level: Needs assistance Equipment used: 2 person hand held assist Transfers: Sit to/from Stand Sit to Stand: Max assist;+2 physical assistance         General transfer comment:  max A +2 physical assist to power up to standing from EOB possibly somewhat due to reluctance/resistance to stand. More assist needed on Lt side. Min A to initiate stand>sit onto comfort height toilet with pt declining to sit (shook head indicating "no") min A to come from partial squat to full stand.    Balance Overall balance assessment: Needs  assistance Sitting-balance support: No upper extremity supported;Feet supported Sitting balance-Leahy Scale: Fair     Standing balance support: Bilateral upper extremity supported;During functional activity Standing balance-Leahy Scale: Poor Standing balance comment: needs assist to varying degrees (min guard to max A) during standing                    ADL Overall ADL's : At baseline                                       General ADL Comments: Pt's family has provided total assist for ADLs for years.      Vision                     Perception     Praxis      Cognition   Behavior During Therapy: Flat affect Overall Cognitive Status: History of cognitive impairments - at baseline                       Extremity/Trunk Assessment  Upper Extremity Assessment Upper Extremity Assessment: Overall WFL for tasks assessed (used BUE during bed mobility and to assist with sit>stand)   Lower Extremity Assessment Lower Extremity Assessment: Defer to PT evaluation        Exercises     Shoulder Instructions       General Comments      Pertinent Vitals/ Pain       Pain  Assessment: Faces Faces Pain Scale: No hurt  Home Living Family/patient expects to be discharged to:: Private residence Living Arrangements: Other relatives Available Help at Discharge: Family;Available 24 hours/day Type of Home: Apartment Home Access: Level entry     Home Layout: One level               Home Equipment: None   Additional Comments: Pt's nephew reports they would find a w/c beneficial. Pt has several family members that have been providing care.      Prior Functioning/Environment Level of Independence: Needs assistance  Gait / Transfers Assistance Needed: nephew reports her recent baseline is requires assist to walk; he states it varies based on pt interest; they use a wheelchair to take her out (to MD appts, to church) ADL's / Homemaking  Assistance Needed: several family members assist with ADLs, nephew reports total assist for all ADLs +2 for showering, dressing. Pt's family has been caring for her for years with nephew reporting pt is at baseline with mobility.        Frequency       Progress Toward Goals  OT Goals(current goals can now be found in the care plan section)     Acute Rehab OT Goals Patient Stated Goal: nephew reports goal is to get pt home   Plan      Co-evaluation    PT/OT/SLP Co-Evaluation/Treatment: Yes Reason for Co-Treatment: Complexity of the patient's impairments (multi-system involvement);Other (comment) (family assist to interpret) PT goals addressed during session: Mobility/safety with mobility;Balance OT goals addressed during session: ADL's and self-care      End of Session Equipment Utilized During Treatment: Gait belt   Activity Tolerance Patient tolerated treatment well   Patient Left in bed;with call bell/phone within reach;with bed alarm set;with family/visitor present   Nurse Communication          Time: 1130-1146 OT Time Calculation (min): 16 min  Charges: OT General Charges $OT Visit: 1 Procedure OT Evaluation $Initial OT Evaluation Tier I: 1 Procedure  Rachel Bush, Malique Driskill H 06/15/2014, 1:45 PM

## 2014-06-18 ENCOUNTER — Ambulatory Visit: Payer: Medicaid - Out of State | Attending: Family Medicine | Admitting: Family Medicine

## 2014-06-18 VITALS — BP 91/64 | HR 78 | Temp 98.3°F | Resp 16 | Ht <= 58 in

## 2014-06-18 DIAGNOSIS — J690 Pneumonitis due to inhalation of food and vomit: Secondary | ICD-10-CM | POA: Diagnosis not present

## 2014-06-18 DIAGNOSIS — G40909 Epilepsy, unspecified, not intractable, without status epilepticus: Secondary | ICD-10-CM | POA: Insufficient documentation

## 2014-06-18 DIAGNOSIS — R4701 Aphasia: Secondary | ICD-10-CM | POA: Diagnosis not present

## 2014-06-18 DIAGNOSIS — G8194 Hemiplegia, unspecified affecting left nondominant side: Secondary | ICD-10-CM | POA: Insufficient documentation

## 2014-06-18 DIAGNOSIS — R4189 Other symptoms and signs involving cognitive functions and awareness: Secondary | ICD-10-CM | POA: Insufficient documentation

## 2014-06-18 NOTE — Patient Instructions (Signed)
Take medications as prescribed.  Follow-up with hospital as instructed for seizures.  Follow-up appointment here in 3 weeks.

## 2014-06-18 NOTE — Progress Notes (Signed)
History obtained via PI # [id number].  Patient is non-verbal so information was gathered from her nephew  44 year old [place] female with history of severe cognitive impairment (nonverbal, nonambulatory at baseline - fully dependent on ADLs), seizure disorder, and left hemiparesis [facility]. History was obtained from the daughter-in-law who is able to communicate in [place], as well as via an interpreter. Starting the night prior to her admit the patient had multiple seizure episodes occurring every 5 minutes. Neurology was consulted, patient was given an additional dose of IV Keppra, IV Depakote and then Vimpat was started. According to Neurologist patient has clinical seizures without status epilepticus by EEG monitoring.   In ED CT head was negative for acute findings, lactic acid was elevated at 2.83, WBCs 22.1, and CXR and UA with no abnormalities.   3/2- pt with evidence of aspiration pneumonia, placed on IV Vanc/Zosyn.

## 2014-06-18 NOTE — Progress Notes (Signed)
Patient is here for followup of a 6 day stay in hospital for seizures. She is a very complicated.patient as she has had seizures since birth, she is mute, has severe cognitive disfunction, fully dependent for ADL, and left hemiparesis.  She and her family move here from [place] about a month ago. She did have medicaid in [place] but [place].  I am getting information with a interpreter through her son. He states she has seizures fairly frequently but they do not take her to hospital unless they become prolonged. She was admitted to hospital on March 1st and was discharge on 3/8/  She was in intractable seizures. These started the night before and happened about every five minutes. Patient was treated with Keppra, Depakote and Vimpat. Here hospital course was complicated by Aspiration pneumoniawith acute respiratory failure, and  Hypokalemia.At discharge she had no seizures for 72 hours. Her nephew reports 3 seizures since her discharge on the 8th but reports they were no bad enough to take her to the hospital.See not below for further needed details.  Objectively:  She is awake and seems alert but non-verbal. She sits slumped in the wheel chair. Head is normo-cephalic, atramatic, PERL. Does not follow commands. Neck is supple FROM w/o adenopathy or tenderness.  Her lungs are clear to auscultation. Respirations are unlabored. HS are negular w/o m,g,r. Abd is soft. There is no response indicating tenderness. BS are regular.There is left sided hemi paresis present  Assessment:  Seizure disorder Severe cognitive impairment Left sided hemiparesis S/P respiratory failure due to aspiration pneumonia.   Plan:  Seizure disorder:  Continue medications as prescribed by neurologist in ED         Return to ED for further seizure active as instructed.   Severe cognitive inpairment and Left sided hemiparesis:  Continue care at home by family.  Have recommended they follow-up  regarding Medicaid.

## 2014-06-28 ENCOUNTER — Ambulatory Visit: Payer: Medicaid - Out of State | Attending: Family Medicine | Admitting: Family Medicine

## 2014-06-28 ENCOUNTER — Encounter: Payer: Self-pay | Admitting: Family Medicine

## 2014-06-28 VITALS — BP 92/63 | HR 83 | Temp 97.4°F | Resp 16 | Ht <= 58 in | Wt 95.0 lb

## 2014-06-28 DIAGNOSIS — A419 Sepsis, unspecified organism: Secondary | ICD-10-CM

## 2014-06-28 DIAGNOSIS — E871 Hypo-osmolality and hyponatremia: Secondary | ICD-10-CM

## 2014-06-28 DIAGNOSIS — G40319 Generalized idiopathic epilepsy and epileptic syndromes, intractable, without status epilepticus: Secondary | ICD-10-CM

## 2014-06-28 DIAGNOSIS — J189 Pneumonia, unspecified organism: Secondary | ICD-10-CM | POA: Diagnosis not present

## 2014-06-28 DIAGNOSIS — G40909 Epilepsy, unspecified, not intractable, without status epilepticus: Secondary | ICD-10-CM | POA: Insufficient documentation

## 2014-06-28 DIAGNOSIS — G40311 Generalized idiopathic epilepsy and epileptic syndromes, intractable, with status epilepticus: Secondary | ICD-10-CM

## 2014-06-28 DIAGNOSIS — E876 Hypokalemia: Secondary | ICD-10-CM

## 2014-06-28 LAB — COMPLETE METABOLIC PANEL WITH GFR
ALBUMIN: 3.6 g/dL (ref 3.5–5.2)
ALK PHOS: 78 U/L (ref 39–117)
ALT: 11 U/L (ref 0–35)
AST: 12 U/L (ref 0–37)
BILIRUBIN TOTAL: 0.2 mg/dL (ref 0.2–1.2)
BUN: 9 mg/dL (ref 6–23)
CO2: 28 mEq/L (ref 19–32)
Calcium: 9.1 mg/dL (ref 8.4–10.5)
Chloride: 102 mEq/L (ref 96–112)
Creat: 0.51 mg/dL (ref 0.50–1.10)
GFR, Est African American: >89 mL/min
Glucose, Bld: 90 mg/dL (ref 70–99)
POTASSIUM: 4.2 meq/L (ref 3.5–5.3)
SODIUM: 137 meq/L (ref 135–145)
TOTAL PROTEIN: 6.4 g/dL (ref 6.0–8.3)

## 2014-06-28 LAB — CBC
HCT: 35.6 % — ABNORMAL LOW (ref 36.0–46.0)
Hemoglobin: 11.7 g/dL — ABNORMAL LOW (ref 12.0–15.0)
MCH: 29.5 pg (ref 26.0–34.0)
MCHC: 32.9 g/dL (ref 30.0–36.0)
MCV: 89.7 fL (ref 78.0–100.0)
MPV: 11.8 fL (ref 8.6–12.4)
PLATELETS: 434 10*3/uL — AB (ref 150–400)
RBC: 3.97 MIL/uL (ref 3.87–5.11)
RDW: 14.5 % (ref 11.5–15.5)
WBC: 15.3 10*3/uL — ABNORMAL HIGH (ref 4.0–10.5)

## 2014-06-28 MED ORDER — LEVETIRACETAM 100 MG/ML PO SOLN: 1500.0000 mg | Freq: Two times a day (BID) | ORAL | Status: DC

## 2014-06-28 MED ORDER — VALPROIC ACID 250 MG/5ML PO SYRP: 500.0000 mg | ORAL_SOLUTION | Freq: Two times a day (BID) | ORAL | Status: DC

## 2014-06-28 NOTE — Assessment & Plan Note (Signed)
CMP today

## 2014-06-28 NOTE — Patient Instructions (Signed)
Ms. Rachel Bush,  Thank you for coming in today. It was a pleasure meeting you. I look forward to being your primary doctor.   1. Seizure disorder: Refilled keppra and Depakote to onsite pharmacy. Referral to neurology.  Medicaid pending: Please apply for [place] discount and orange card, you can also inquire if any of your medications are on the PASS (medications assistance) list.    F/u with me in 2 months  Dr. Armen PickupFunches

## 2014-06-28 NOTE — Progress Notes (Signed)
[employer] Interpreted Nepali [phone]#[id number] Establish Care Hx Sz

## 2014-06-28 NOTE — Assessment & Plan Note (Signed)
  1. Seizure disorder: Refilled keppra and Depakote to onsite pharmacy. Referral to neurology.  Medicaid pending: Please apply for [place] discount and orange card, you can also inquire if any of your medications are on the PASS (medications assistance) list.

## 2014-06-28 NOTE — Assessment & Plan Note (Signed)
CMP today still taking K+ supplement

## 2014-06-28 NOTE — Progress Notes (Signed)
   Subjective:    Patient ID: Rachel Bush, female    DOB: 03/01/1971, 44 y.o.   MRN: [id number] CC: meet PCP, f/u seizures  Nepali interpreter on the phone  HPI Seizure Disorder Patient presents with her nephew. She has not had a seizure since her discharge from the hospital. No fever. No altered mental status. No vaginal itching. She has finished augmentin. She continues to take keppra and depakote. Affording the medication is a big problem as her medicaid is still pending.   Soc Hx: non smoker  Review of Systems     Objective:   Physical Exam BP 92/63 mmHg  Pulse 83  Temp(Src) 97.4 F (36.3 C) (Oral)  Resp 16  Ht 4\' 8"  (1.422 m)  Wt 95 lb (43.092 kg)  BMI 21.31 kg/m2  SpO2 98%  LMP 06/12/2014 General appearance: alert, cooperative and no distress  Throat: very poor dentition  Lungs: clear to auscultation bilaterally Heart: regular rate and rhythm, S1, S2 normal, no murmur, click, rub or gallop Neurologic: alert, calm, following commands. Non verbal.     Assessment & Plan:

## 2014-06-29 LAB — VALPROIC ACID LEVEL: VALPROIC ACID LVL: 95.4 ug/mL (ref 50.0–100.0)

## 2014-06-30 LAB — LEVETIRACETAM LEVEL: KEPPRA (LEVETIRACETAM): 10.2 ug/mL

## 2014-07-09 ENCOUNTER — Telehealth: Payer: Self-pay | Admitting: *Deleted

## 2014-07-09 NOTE — Telephone Encounter (Signed)
Pt aware of lab results 

## 2014-07-09 NOTE — Telephone Encounter (Signed)
-----   Message from Dessa PhiJosalyn Funches, MD sent at 07/01/2014  4:59 PM EDT ----- Slightly elevated WBC Stable Hgb Normal CMP,  Normal keppra and depakote levels

## 2014-08-16 ENCOUNTER — Ambulatory Visit: Payer: Medicaid Other | Attending: Family Medicine | Admitting: Family Medicine

## 2014-08-16 ENCOUNTER — Encounter: Payer: Self-pay | Admitting: Family Medicine

## 2014-08-16 VITALS — BP 95/64 | HR 70 | Temp 98.3°F | Resp 16 | Ht <= 58 in | Wt 98.8 lb

## 2014-08-16 DIAGNOSIS — J309 Allergic rhinitis, unspecified: Secondary | ICD-10-CM | POA: Insufficient documentation

## 2014-08-16 DIAGNOSIS — D72829 Elevated white blood cell count, unspecified: Secondary | ICD-10-CM | POA: Diagnosis not present

## 2014-08-16 DIAGNOSIS — J301 Allergic rhinitis due to pollen: Secondary | ICD-10-CM

## 2014-08-16 DIAGNOSIS — G40311 Generalized idiopathic epilepsy and epileptic syndromes, intractable, with status epilepticus: Secondary | ICD-10-CM

## 2014-08-16 DIAGNOSIS — G40319 Generalized idiopathic epilepsy and epileptic syndromes, intractable, without status epilepticus: Secondary | ICD-10-CM

## 2014-08-16 DIAGNOSIS — K088 Other specified disorders of teeth and supporting structures: Secondary | ICD-10-CM

## 2014-08-16 DIAGNOSIS — Z114 Encounter for screening for human immunodeficiency virus [HIV]: Secondary | ICD-10-CM

## 2014-08-16 DIAGNOSIS — K089 Disorder of teeth and supporting structures, unspecified: Secondary | ICD-10-CM

## 2014-08-16 LAB — CBC
HCT: 40.7 % (ref 36.0–46.0)
HEMOGLOBIN: 13.5 g/dL (ref 12.0–15.0)
MCH: 29.7 pg (ref 26.0–34.0)
MCHC: 33.2 g/dL (ref 30.0–36.0)
MCV: 89.5 fL (ref 78.0–100.0)
MPV: 12.2 fL (ref 8.6–12.4)
PLATELETS: 276 10*3/uL (ref 150–400)
RBC: 4.55 MIL/uL (ref 3.87–5.11)
RDW: 14.6 % (ref 11.5–15.5)
WBC: 11.7 10*3/uL — ABNORMAL HIGH (ref 4.0–10.5)

## 2014-08-16 MED ORDER — VALPROIC ACID 250 MG/5ML PO SYRP: 500.0000 mg | ORAL_SOLUTION | Freq: Two times a day (BID) | ORAL | Status: DC

## 2014-08-16 MED ORDER — LEVETIRACETAM 100 MG/ML PO SOLN: 1500.0000 mg | Freq: Two times a day (BID) | ORAL | Status: DC

## 2014-08-16 MED ORDER — CETIRIZINE HCL 10 MG PO TABS: 10.0000 mg | ORAL_TABLET | Freq: Every day | ORAL | Status: DC

## 2014-08-16 NOTE — Assessment & Plan Note (Signed)
Allergy symptoms with sinus pressure: start zyrtec 10 mg once daily

## 2014-08-16 NOTE — Patient Instructions (Signed)
Ms. Rachel Bush,  Thank you for coming in today.  1. Seizures: One seizure is better than many the goal is none Continue depakote and keppra  2. Elevated WBC at last visit: recheck today  3 screening HIV today  4. Allergy symptoms with sinus pressure: start zyrtec 10 mg once daily   Due for pap smear will get a f/u visit in 6 weeks   Dr. Armen PickupFunches

## 2014-08-16 NOTE — Progress Notes (Signed)
   Subjective:    Patient ID: Rachel Bush, female    DOB: 09/20/1970, 44 y.o.   MRN: [id number] CC: f/u seizure disorder  Nepali interpreter used  HPI  1. Seizure disorder:  No seizure since last OV. Last seizure was the in March and patient went to ED. Has HA, midline frontal. Reports poor vision. No fever, chills, vision loss, double vision.    2. Elevated WBC: no fever, cough cold symptoms.  3. Poor dentition: does not have a dental home  4. HM: due for Tdap, screening HIV, pap.   Soc Hx: non smoker  Review of Systems  Constitutional: Negative for fever and chills.  HENT: Positive for congestion and sinus pressure.   Eyes: Positive for visual disturbance.  Neurological: Positive for seizures and headaches. Negative for dizziness, tremors, syncope, facial asymmetry, speech difficulty, weakness, light-headedness and numbness.       Objective:   Physical Exam BP 95/64 mmHg  Pulse 70  Temp(Src) 98.3 F (36.8 C) (Oral)  Resp 16  Ht 4\' 8"  (1.422 m)  Wt 98 lb 12.8 oz (44.815 kg)  BMI 22.16 kg/m2  SpO2 97%  LMP 08/04/2014  Wt Readings from Last 3 Encounters:  08/16/14 98 lb 12.8 oz (44.815 kg)  06/28/14 95 lb (43.092 kg)  06/08/14 93 lb 0.6 oz (42.2 kg)  General appearance: alert, cooperative and no distress  Head: Normocephalic, without obvious abnormality, atraumatic Eyes: conjunctivae/corneas clear. PERRL, EOM's intact.  Ears: abnormal TM right ear - retracted and abnormal TM left ear - retracted Nose: no discharge, turbinates pink, swollen Throat: normal findings: tongue midline and normal and oropharynx pink & moist without lesions or evidence of thrush and abnormal findings: dentition: poor Lungs: clear to auscultation bilaterally Heart: regular rate and rhythm, S1, S2 normal, no murmur, click, rub or gallop    Assessment & Plan:

## 2014-08-16 NOTE — Assessment & Plan Note (Signed)
A: poor dentition P: dental referral placed  

## 2014-08-16 NOTE — Progress Notes (Signed)
Used [employer] Interpreted Nepali [phone]#[id number] F/U Sz  Stated one Sz since last visit

## 2014-08-16 NOTE — Assessment & Plan Note (Signed)
Elevated WBC at last visit: recheck today  3 screening HIV today  4. Allergy symptoms with sinus pressure: start zyrtec 10 mg once daily

## 2014-08-16 NOTE — Assessment & Plan Note (Signed)
Screening HIV ordered  

## 2014-08-16 NOTE — Assessment & Plan Note (Signed)
A: Seizures disorder well controlled. Not yet established with neurology. P:Continue depakote and keppra

## 2014-08-17 LAB — HIV ANTIBODY (ROUTINE TESTING W REFLEX): HIV 1&2 Ab, 4th Generation: NONREACTIVE

## 2014-09-03 ENCOUNTER — Telehealth: Payer: Self-pay | Admitting: *Deleted

## 2014-09-03 NOTE — Telephone Encounter (Signed)
-----   Message from Dessa PhiJosalyn Funches, MD sent at 08/17/2014  3:53 PM EDT ----- Screening HIV negative CBC with mildly elevated WBC, otherwise normal. No evidence of infection

## 2014-09-03 NOTE — Telephone Encounter (Signed)
LVM to return call.

## 2014-09-27 ENCOUNTER — Ambulatory Visit: Payer: Self-pay | Attending: Family Medicine | Admitting: Family Medicine

## 2014-09-27 ENCOUNTER — Encounter: Payer: Self-pay | Admitting: Family Medicine

## 2014-09-27 VITALS — BP 104/70 | HR 77 | Temp 97.3°F | Resp 18 | Ht <= 58 in | Wt 103.0 lb

## 2014-09-27 DIAGNOSIS — G40309 Generalized idiopathic epilepsy and epileptic syndromes, not intractable, without status epilepticus: Secondary | ICD-10-CM

## 2014-09-27 DIAGNOSIS — G40319 Generalized idiopathic epilepsy and epileptic syndromes, intractable, without status epilepticus: Secondary | ICD-10-CM

## 2014-09-27 DIAGNOSIS — Z124 Encounter for screening for malignant neoplasm of cervix: Secondary | ICD-10-CM

## 2014-09-27 DIAGNOSIS — G40311 Generalized idiopathic epilepsy and epileptic syndromes, intractable, with status epilepticus: Secondary | ICD-10-CM

## 2014-09-27 DIAGNOSIS — G40909 Epilepsy, unspecified, not intractable, without status epilepticus: Secondary | ICD-10-CM

## 2014-09-27 LAB — POCT URINALYSIS DIPSTICK
Bilirubin, UA: NEGATIVE
Blood, UA: NEGATIVE
GLUCOSE UA: NEGATIVE
Ketones, UA: NEGATIVE
Leukocytes, UA: NEGATIVE
NITRITE UA: NEGATIVE
Protein, UA: NEGATIVE
Spec Grav, UA: 1.02
Urobilinogen, UA: 1
pH, UA: 6

## 2014-09-27 LAB — COMPLETE METABOLIC PANEL WITH GFR
ALBUMIN: 4.1 g/dL (ref 3.5–5.2)
ALT: 11 U/L (ref 0–35)
AST: 24 U/L (ref 0–37)
Alkaline Phosphatase: 80 U/L (ref 39–117)
BUN: 7 mg/dL (ref 6–23)
CALCIUM: 9.2 mg/dL (ref 8.4–10.5)
CO2: 24 mEq/L (ref 19–32)
CREATININE: 0.61 mg/dL (ref 0.50–1.10)
Chloride: 101 mEq/L (ref 96–112)
GFR, Est African American: >89 mL/min
GFR, Est Non African American: >89 mL/min
Glucose, Bld: 97 mg/dL (ref 70–99)
Potassium: 5.4 mEq/L — ABNORMAL HIGH (ref 3.5–5.3)
Sodium: 137 mEq/L (ref 135–145)
Total Bilirubin: 0.3 mg/dL (ref 0.2–1.2)
Total Protein: 7.2 g/dL (ref 6.0–8.3)

## 2014-09-27 LAB — CBC
HCT: 39.6 % (ref 36.0–46.0)
Hemoglobin: 13.2 g/dL (ref 12.0–15.0)
MCH: 29.1 pg (ref 26.0–34.0)
MCHC: 33.3 g/dL (ref 30.0–36.0)
MCV: 87.2 fL (ref 78.0–100.0)
MPV: 12.1 fL (ref 8.6–12.4)
Platelets: 301 10*3/uL (ref 150–400)
RBC: 4.54 MIL/uL (ref 3.87–5.11)
RDW: 14.5 % (ref 11.5–15.5)
WBC: 9.9 10*3/uL (ref 4.0–10.5)

## 2014-09-27 LAB — POCT URINE PREGNANCY: Preg Test, Ur: NEGATIVE

## 2014-09-27 MED ORDER — VALPROIC ACID 250 MG/5ML PO SYRP: 750.0000 mg | ORAL_SOLUTION | Freq: Two times a day (BID) | ORAL | Status: DC

## 2014-09-27 NOTE — Assessment & Plan Note (Signed)
Patient unable to sit still for exam in office Gyn referral placed for pap with sedation

## 2014-09-27 NOTE — Progress Notes (Signed)
Annual physical Stated Sz x 10 in one month   Taking medication as Prescribed

## 2014-09-27 NOTE — Progress Notes (Signed)
   Subjective:    Patient ID: Rachel Bush, female    DOB: November 05, 1970, 44 y.o.   MRN: [id number] CC: pap smear, increased seizure activity  Nepali intepreter present  HPI 44 yo F with seizure disorder, cognitive impairment presents with her sisters. Most of there history is provided by the patient's sisters.   1. Pap smear: patient is not sexually active. No abnormal paps. No vaginal discharge or abnormal vaginal bleeding.    2. Seizures: had 10 seizures since last OV. No cough, fever, dysuria, skin rash, gum pain or swelling. Compliant with depakene and keppra. Has not established care with neurology. Seizures witnessed by family and are generalized. There has been no change in sleep pattern, evidence of increased stress, drug use or excessive heat exposure.    Soc Hx: nonsmoker  Review of Systems  Constitutional: Negative for fever and chills.  Genitourinary: Negative for vaginal bleeding, vaginal discharge, menstrual problem and pelvic pain.  Neurological: Positive for seizures.      Objective:   Physical Exam BP 104/70 mmHg  Pulse 77  Temp(Src) 97.3 F (36.3 C) (Oral)  Resp 18  Ht 4\' 8"  (1.422 m)  Wt 103 lb (46.72 kg)  BMI 23.10 kg/m2  SpO2 98%  LMP 09/02/2014 General appearance: alert, cooperative and no distress  Throat: normal findings: oropharynx pink & moist without lesions or evidence of thrush and abnormal findings: dentition: multiple carries Neck: no adenopathy, supple, symmetrical, trachea midline and thyroid not enlarged, symmetric, no tenderness/mass/nodules Back: symmetric, no curvature. ROM normal. No CVA tenderness. Lungs: clear to auscultation bilaterally Heart: regular rate and rhythm, S1, S2 normal, no murmur, click, rub or gallop Abdomen: soft, non-tender; bowel sounds normal; no masses,  no organomegaly Pelvic: moderate amount of white discharge, normal external genitalia. Patient did not tolerate speculum exam       Assessment & Plan:

## 2014-09-27 NOTE — Assessment & Plan Note (Addendum)
Increase seizure activity w/o evidence of infection or history of increased stress or sleep deprivation  Increase depakene from 500 mg twice daily to 750 mg twice daily, 15 ml Checking labs and urine to rule out infection or electrolyte abnormality Neurology referral  keppra level normal Valproic acid (depakene) evated despite increased seizure activity, depakene dose was increased at last OV Advise dose decrease back down to 500 mg BID to avoid toxicity Normal CBC Normal BMP except slight elevation in K+

## 2014-09-27 NOTE — Patient Instructions (Addendum)
Ms. Kniceley,  Thank you for coming in today  1. Increase seizures: Increase depakene from 500 mg twice daily to 750 mg twice daily, 15 ml Checking labs and urine to rule out infection or electrolyte abnormality Neurology referral  2. Pap:  Unable to get it done in office  Gyn referral  If you have not already, please apply for orange card.   F/u in 2 months for seizures   Dr. Armen Pickup

## 2014-09-28 ENCOUNTER — Encounter: Payer: Self-pay | Admitting: Obstetrics & Gynecology

## 2014-09-28 LAB — VALPROIC ACID LEVEL: VALPROIC ACID LVL: 134.2 ug/mL — AB (ref 50.0–100.0)

## 2014-09-29 LAB — LEVETIRACETAM LEVEL: KEPPRA (LEVETIRACETAM): 51.5 ug/mL

## 2014-09-29 MED ORDER — VALPROIC ACID 250 MG/5ML PO SYRP: 500.0000 mg | ORAL_SOLUTION | Freq: Two times a day (BID) | ORAL | Status: DC

## 2014-09-29 NOTE — Addendum Note (Signed)
Addended by: Dessa Phi on: 09/29/2014 05:23 PM   Modules accepted: Orders

## 2014-09-30 ENCOUNTER — Telehealth: Payer: Self-pay | Admitting: *Deleted

## 2014-09-30 NOTE — Telephone Encounter (Signed)
-----   Message from Dessa Phi, MD sent at 09/29/2014  5:21 PM EDT ----- keppra level normal Valproic acid (depakene) evated despite increased seizure activity, depakene dose was increased at last OV Advise dose decrease back down to 500 mg BID to avoid toxicity Normal CBC Normal BMP except slight elevation in K+

## 2014-10-05 ENCOUNTER — Encounter: Payer: Self-pay | Admitting: *Deleted

## 2014-10-05 NOTE — Telephone Encounter (Signed)
Used [employer] Interpret[employer] Nepali # [phone] Number not in services

## 2014-10-05 NOTE — Telephone Encounter (Signed)
Letter with results was mail to pt

## 2014-10-25 ENCOUNTER — Telehealth: Payer: Self-pay | Admitting: Family Medicine

## 2014-10-25 ENCOUNTER — Encounter: Payer: Self-pay | Admitting: Neurology

## 2014-10-25 ENCOUNTER — Ambulatory Visit (INDEPENDENT_AMBULATORY_CARE_PROVIDER_SITE_OTHER): Payer: Medicaid Other | Admitting: Neurology

## 2014-10-25 VITALS — BP 104/75 | HR 73 | Ht <= 58 in | Wt 104.4 lb

## 2014-10-25 DIAGNOSIS — R569 Unspecified convulsions: Secondary | ICD-10-CM | POA: Diagnosis not present

## 2014-10-25 NOTE — Patient Instructions (Signed)
Overall you are doing fairly well but I do want to suggest a few things today:   Remember to drink plenty of fluid, eat healthy meals and do not skip any meals. Try to eat protein with a every meal and eat a healthy snack such as fruit or nuts in between meals. Try to keep a regular sleep-wake schedule and try to exercise daily, particularly in the form of walking, 20-30 minutes a day, if you can.   As far as your medications are concerned, I would like to suggest: continue current medications  As far as diagnostic testing: EEG, Labwork  I would like to see you back in 3 months, sooner if we need to. Please call us with any interim questions, concerns, problems, updates or refill requests.   Please also call us for any test results so we can go over those with you on the phone.  My clinical assistant and will answer any of your questions and relay your messages to me and also relay most of my messages to you.   Our phone number is [phone]. We also have an after hours call service for urgent matters and there is a physician on-call for urgent questions. For any emergencies you know to call 911 or go to the nearest emergency room

## 2014-10-25 NOTE — Telephone Encounter (Signed)
Patient's brother dropped paperwork off for personal care services. Patient would like to be called when form is ready. Paperwork placed in PCP's folder. Advised of 14 business day form return.

## 2014-10-25 NOTE — Progress Notes (Addendum)
[facility]    Provider:  Dr Rachel GaskinsAhern Referring Provider: [name], [name], MD Primary Care Physician:  Lora PaulaFUNCHES, [name] C, MD  CC:  Seizures  HPI:  Rachel Bush is a 44 y.o. female here as a referral from Dr. Armen PickupFunches for seizures.  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 She can recognize her family and follow simple commands, her speech is very limited. She can walk short distances. Previous to March of this year [place] available). She is here with an interpreter and her sister. Sister provides all the information unfortunately did not live with [place] in [place] so doesn't have information such as previous baseline of seizures. ED notes state she had a previously baseline of one seizure per day. She was seen in the ED in march for status epilepticus due to subtherapeutic Depakote levels. She is doing well now. She has only had one seizure since last month since her Depakote was increased. She is a little bit more sedated since the increase in medication.  She is having some ankle swelling. She is not having staring spells. She does stare but she gets made when someone bothers her.  Reviewed notes, labs and imaging from outside physicians, which showed: She was seen in the emergency room in the beginning of March. At the time she was on Depakote and Keppra. Notes in the ED state her baseline was approximately one seizure per say that she had been having frequent recurrent seizures over 24 hours. She was on phenobarbital in the past. Depakote level in the emergency room was 0. Patient was in status epilepticus. They did not seek immediate medical attention if there are new to the area. Patient was eventually brought to the emergency room. Patient was given IV Keppra and IV Depakote as well as IV Vimpat. EEG showed clinical seizures without status  epilepticus by EEG monitoring. Hospital course was complicated by sepsis secondary to left lung aspiration pneumonia and acute hypoxic respiratory failure. She was discharged on Keppra 100 mg/mL for a total of 1500 mg twice a day and valproic acid 250 mg per 5 mL take 500 mg twice a day. Valproic acid was increased to 750 mg twice daily.   June 20: Valproic acid level 134, Keppra level 51   She was seen outpatient in June. At that time the family reported she had 10 seizures in the last month.  EEG consistent with moderately severe diffuse encephalopathy as well as seizure disorder with a generalized seizure recorded during the study as well as years to be an epileptogenic focus involving the right hemisphere  Ct head 06/2013: showed No acute intracranial abnormalities including mass lesion or mass effect, hydrocephalus, extra-axial fluid collection, midline shift, hemorrhage, or acute infarction, large ischemic events (personally reviewed images)  ADDENDUM 11/15/2014:  EEG classification 10/27/2014: Dysrhythmia grade 3 generalized, "slow wave seizures"  Description of the recording: The background rhythms of this recording consists of a disorganized background activity of 5-7 Hz. As the record progresses, intermittent episodes of delta frequency slowing in a generalized fashion are seen intermixed with sharp and slow-wave complexes that emanate primarily from the right temporal region. Between the episodes of delta slowing, a lower amplitude theta frequency baseline slowing is seen. Photic stimulation and hyperventilation were not performed. EKG monitor shows no evidence of cardiac rhythm abnormalities with a heart rate of 84.  Impression: This is  an abnormal EEG recording secondary to diffuse background slowing. The patient appears to have episodes of generalized higher amplitude delta slowing intermixed with sharp and slow-wave complexes emanating primarily from the right hemisphere. These episodes  may represent "slow wave seizures" suggesting the patient is having subclinical seizures intermittently during the recording. Clinical correlation is required.  Keppra trough level 39.2, Depakote trough 114. Will add Vimpat   Review of Systems: Patient complains of symptoms per HPI as well as the following symptoms: no cough, no fever, no SOB, no CP. Pertinent negatives per HPI. All others negative.   History   Social History  . Marital Status: Single    Spouse Name: N/A  . Number of Children: 0  . Years of Education: None   Occupational History  . Disabled    Social History Main Topics  . Smoking status: Current Every Day Smoker    Types: Cigarettes  . Smokeless tobacco: Not on file     Comment: 1-2 cigarettes per day.  . Alcohol Use: No  . Drug Use: No  . Sexual Activity: Not on file   Other Topics Concern  . Not on file   Social History Narrative   Lives at home with mother and sister.   Right-handed.   One cup caffeine daily.    Family History  Problem Relation Age of Onset  . Diabetes Mother   . Hypertension Father     Past Medical History  Diagnosis Date  . Seizures     since baby    Past Surgical History  Procedure Laterality Date  . No past surgery      Current Outpatient Prescriptions  Medication Sig Dispense Refill  . cetirizine (ZYRTEC) 10 MG tablet Take 1 tablet (10 mg total) by mouth daily. 30 tablet 11  . levETIRAcetam (KEPPRA) 100 MG/ML solution Take 15 mLs (1,500 mg total) by mouth 2 (two) times daily. 900 mL 11  . Valproic Acid (DEPAKENE) 250 MG/5ML SYRP syrup Take 10 mLs (500 mg total) by mouth 2 (two) times daily. 900 mL 11   No current facility-administered medications for this visit.    Allergies as of 10/25/2014  . (No Known Allergies)    Vitals: BP 104/75 mmHg  Pulse 73  Ht 4\' 8"  (1.422 m)  Wt 104 lb 6.4 oz (47.356 kg)  BMI 23.42 kg/m2  LMP 09/02/2014 Last Weight:  Wt Readings from Last 1 Encounters:  10/25/14 104 lb  6.4 oz (47.356 kg)   Last Height:   Ht Readings from Last 1 Encounters:  10/25/14 4\' 8"  (1.422 m)   Physical exam: Exam: Gen: NAD, not conversant                    CV: RRR, no MRG. No Carotid Bruits. No peripheral edema, warm, nontender Eyes: without exudates or hemorrhage  Neuro: Detailed Neurologic Exam  Speech:    Largey Non verbal but is interactive and responds to voice, nods to questions and smiles Cognition: follow simple commands    The patient is oriented only to name according to sister and recognizes her family members    recent and remote memory intact impaired;      impaired mal attention, concentration,     fund of knowledge impaired Cranial Nerves:    The pupils are equal, round, and reactive to light. Attempted fundoscopiuc exam unable.   Visual fields are full to threat. Extraocular movements are intact. Trigeminal sensation is intact and the muscles of mastication are  normal. The face is symmetric. The palate elevates in the midline. Hearing intact. Voice is normal. Shoulder shrug is normal. The tongue has normal motion without fasciculations.   Coordination:    Difficult to test due to cognitive impairment but no apparent dysmetria   Motor Observation:     no involuntary movements noted. Tone:    Increased right arm   Posture:    Posture is normal. normal erect    Strength:    Antigravity, difficult full exam due to cognition,      Sensation: intact to LT     Reflex Exam:  DTR's: appear brisker on the right     Toes:    The toes are downgoing bilaterally.   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 She can recognize her family and follow simple commands, her speech is very limited. No seizures since June after her primary care increased her valproic acid. Notes state that valproic acid 750 mg twice a day.  Also on Keppra 1500 mg twice a day. Need records from neurologist in [place]. Order labs, AED levels. EEG.  Addendum 11/15/2014 Impression: This is an abnormal EEG recording secondary to diffuse background slowing. The patient appears to have episodes of generalized higher amplitude delta slowing intermixed with sharp and slow-wave complexes emanating primarily from the right hemisphere. These episodes may represent "slow wave seizures" suggesting the patient is having subclinical seizures intermittently during the recording. Clinical correlation is required.  Keppra trough level 39.2, Depakote trough 114. Will add Vimpat  Naomie Dean, MD  [facility] [address] [place], [place] [address]  Phone [phone] Fax [phone]

## 2014-10-26 ENCOUNTER — Other Ambulatory Visit (INDEPENDENT_AMBULATORY_CARE_PROVIDER_SITE_OTHER): Payer: Self-pay

## 2014-10-26 DIAGNOSIS — Z0289 Encounter for other administrative examinations: Secondary | ICD-10-CM

## 2014-10-27 ENCOUNTER — Ambulatory Visit (INDEPENDENT_AMBULATORY_CARE_PROVIDER_SITE_OTHER): Payer: Medicaid Other | Admitting: Neurology

## 2014-10-27 ENCOUNTER — Other Ambulatory Visit: Payer: Self-pay | Admitting: *Deleted

## 2014-10-27 ENCOUNTER — Telehealth: Payer: Self-pay | Admitting: *Deleted

## 2014-10-27 ENCOUNTER — Other Ambulatory Visit (INDEPENDENT_AMBULATORY_CARE_PROVIDER_SITE_OTHER): Payer: Self-pay

## 2014-10-27 DIAGNOSIS — R569 Unspecified convulsions: Secondary | ICD-10-CM

## 2014-10-27 DIAGNOSIS — Z0289 Encounter for other administrative examinations: Secondary | ICD-10-CM

## 2014-10-27 NOTE — Telephone Encounter (Signed)
Medical form ready to be pick up Pt aware

## 2014-10-27 NOTE — Procedures (Signed)
     History: Rachel GaskinsDamanta Bush is a 44 year old patient with a history of a chronic encephalopathy and a history of seizures since birth, and a history of a left hemiparesis. The patient has intractable epilepsy, she is being evaluated for this issue.  This is a routine EEG. No skull defects are noted. Medications include Zyrtec, Keppra, and Depakote.  EEG classification: Dysrhythmia grade 3 generalized, "slow wave seizures"  Description of the recording: The background rhythms of this recording consists of a disorganized background activity of 5-7 Hz. As the record progresses, intermittent episodes of delta frequency slowing in a generalized fashion are seen intermixed with sharp and slow-wave complexes that emanate primarily from the right temporal region. Between the episodes of delta slowing, a lower amplitude theta frequency baseline slowing is seen. Photic stimulation and hyperventilation were not performed. EKG monitor shows no evidence of cardiac rhythm abnormalities with a heart rate of 84.  Impression: This is an abnormal EEG recording secondary to diffuse background slowing. The patient appears to have episodes of generalized higher amplitude delta slowing intermixed with sharp and slow-wave complexes emanating primarily from the right hemisphere. These episodes may represent "slow wave seizures" suggesting the patient is having subclinical seizures intermittently during the recording. Clinical correlation is required.

## 2014-10-27 NOTE — Telephone Encounter (Signed)
Patient has been to lab two different days with unsuccessful attempts to give blood samples.  With patient's consent, an IV was started (#24g x 1 attempt in left FA) and she was given  of nss.  The family declined any further fluid for her dehydration.  The IV was discontinued w/ cath intact, no redness/swelling noted at site, pressure/dressing was applied.  Patient left office with family at side.  She was instructed to continue hydration at home and she will return to the lab again for another attempt at a sample collection.

## 2014-10-28 ENCOUNTER — Other Ambulatory Visit: Payer: Self-pay | Admitting: *Deleted

## 2014-10-28 ENCOUNTER — Telehealth: Payer: Self-pay | Admitting: *Deleted

## 2014-10-28 DIAGNOSIS — Z91199 Patient's noncompliance with other medical treatment and regimen due to unspecified reason: Secondary | ICD-10-CM

## 2014-10-28 DIAGNOSIS — Z9119 Patient's noncompliance with other medical treatment and regimen: Secondary | ICD-10-CM

## 2014-10-28 NOTE — Telephone Encounter (Signed)
If she doesn't have seizures on this dosage , we need no adjustments at all. ! CD

## 2014-10-28 NOTE — Telephone Encounter (Signed)
Release faxed to [facility] requesting records.

## 2014-10-28 NOTE — Telephone Encounter (Signed)
Left VM for Ram or Danja to call back about pt. Pt needs to come in the morning for repeat lab work. She should not take morning seizure medication and should bring her medication and what she uses to take meds with her. Please let me know if they call back. You can come get me, skype, ect. Thank you!

## 2014-10-29 LAB — COMPREHENSIVE METABOLIC PANEL
A/G RATIO: 1.4 (ref 1.1–2.5)
ALBUMIN: 4 g/dL (ref 3.5–5.5)
ALT: 13 IU/L (ref 0–32)
AST: 23 IU/L (ref 0–40)
Alkaline Phosphatase: 77 IU/L (ref 39–117)
BILIRUBIN TOTAL: 0.2 mg/dL (ref 0.0–1.2)
BUN/Creatinine Ratio: 6 — ABNORMAL LOW (ref 9–23)
BUN: 5 mg/dL — ABNORMAL LOW (ref 6–24)
CALCIUM: 9.1 mg/dL (ref 8.7–10.2)
CHLORIDE: 96 mmol/L — AB (ref 97–108)
CO2: 23 mmol/L (ref 18–29)
Creatinine, Ser: 0.78 mg/dL (ref 0.57–1.00)
GFR calc non Af Amer: 94 mL/min/{1.73_m2} (ref >59)
GFR, EST AFRICAN AMERICAN: 108 mL/min/{1.73_m2} (ref >59)
GLUCOSE: 100 mg/dL — AB (ref 65–99)
Globulin, Total: 2.8 g/dL (ref 1.5–4.5)
Potassium: 4.7 mmol/L (ref 3.5–5.2)
SODIUM: 136 mmol/L (ref 134–144)
TOTAL PROTEIN: 6.8 g/dL (ref 6.0–8.5)

## 2014-10-29 LAB — CBC
HEMATOCRIT: 38.7 % (ref 34.0–46.6)
Hemoglobin: 12.8 g/dL (ref 11.1–15.9)
MCH: 29.8 pg (ref 26.6–33.0)
MCHC: 33.1 g/dL (ref 31.5–35.7)
MCV: 90 fL (ref 79–97)
PLATELETS: 257 10*3/uL (ref 150–379)
RBC: 4.29 x10E6/uL (ref 3.77–5.28)
RDW: 15.2 % (ref 12.3–15.4)
WBC: 8.2 10*3/uL (ref 3.4–10.8)

## 2014-10-29 LAB — LEVETIRACETAM LEVEL: Levetiracetam Lvl: 53.7 ug/mL — ABNORMAL HIGH (ref 10.0–40.0)

## 2014-10-29 LAB — VALPROIC ACID LEVEL: VALPROIC ACID LVL: 133 ug/mL — AB (ref 50–100)

## 2014-10-29 NOTE — Telephone Encounter (Signed)
Thanks Dr. Vickey Huger. Sorry to bother you. I am actually on vacation in [place] otherwise I do take care of my own calls when out of the office and try not to bother the work-in doctor.  I really appreciate your helping me, thank you so much!! And I may need some guidance on this patient from an epileptologist like yourself, she has severe encephalopathy and it appears ongoing seizures. I wonder if I should refer her to the epileptologist at [facility]. Thanks,

## 2014-10-29 NOTE — Telephone Encounter (Signed)
LVM for patient to call office ASAP.  Patient needs to come into office this morning for repeat lab work, she should not take her morning seizure medications and needs to bring her medications with her.

## 2014-11-01 NOTE — Telephone Encounter (Signed)
Rachel Bush spoke to me on Thursday and we decided to repeat a true trough level.

## 2014-11-02 NOTE — Telephone Encounter (Signed)
I tried calling again. No one picked up and the mailbox is full. Would you compose a letter saying the eeg was abnormal and showed seizure activity. We need them to come back to the office for more bloodwork one morning before she has taken her seizure medication. I will help you work on it in the morning. Thanks.

## 2014-11-03 ENCOUNTER — Encounter: Payer: Self-pay | Admitting: Obstetrics & Gynecology

## 2014-11-03 ENCOUNTER — Ambulatory Visit (INDEPENDENT_AMBULATORY_CARE_PROVIDER_SITE_OTHER): Payer: Medicaid Other | Admitting: Obstetrics & Gynecology

## 2014-11-03 ENCOUNTER — Encounter: Payer: Self-pay | Admitting: *Deleted

## 2014-11-03 DIAGNOSIS — Z0000 Encounter for general adult medical examination without abnormal findings: Secondary | ICD-10-CM

## 2014-11-03 DIAGNOSIS — Z1231 Encounter for screening mammogram for malignant neoplasm of breast: Secondary | ICD-10-CM | POA: Diagnosis not present

## 2014-11-03 NOTE — Telephone Encounter (Signed)
Letter sent today, thank you!

## 2014-11-03 NOTE — Progress Notes (Signed)
Patient ID: Rachel Bush, female   DOB: Oct 19, 1970, 44 y.o.   MRN: [id number] Pt in with sister who is her guardian but, not legally.  Pt has never been sexually active.  She is completely disabled.  She has no risk factors for cervical cancer.  An exam was attempted at her primary care physicians ofc with no success.  Pt was referred for EUA. After speaking with the sister the pt has no risk factors that would necessitate the risk of an EUA.  The pt has no bleeding issues.  She has menses q 28 days for 3-7 days.   I have discussed with both sisters the screening for breast cancer.  They do want to be screened for breast cancer. spent with pt and sister.  [place] interpreter used.  Miciah Shealy L. Harraway-Smith, M.D., [place]

## 2014-11-05 NOTE — Telephone Encounter (Signed)
Kara Mead - we need to send a registered letter so we get a signature. Can you send the letter again but registered. Stanton Kidney wil take it to the post office for [place]. thanks

## 2014-11-05 NOTE — Telephone Encounter (Signed)
Called againb today. No answer. Mailbox full. Letter was sent to home regarding EEG showing seizures as asking them to follow back up with [place].

## 2014-11-08 ENCOUNTER — Other Ambulatory Visit (INDEPENDENT_AMBULATORY_CARE_PROVIDER_SITE_OTHER): Payer: Self-pay

## 2014-11-08 ENCOUNTER — Other Ambulatory Visit: Payer: Self-pay | Admitting: Neurology

## 2014-11-08 DIAGNOSIS — R569 Unspecified convulsions: Secondary | ICD-10-CM

## 2014-11-08 DIAGNOSIS — Z0289 Encounter for other administrative examinations: Secondary | ICD-10-CM

## 2014-11-08 NOTE — Telephone Encounter (Signed)
Thank you, I order depakote and keppra levels. Just verify they are in there thanks

## 2014-11-08 NOTE — Telephone Encounter (Signed)
Orders are there, thank you!

## 2014-11-09 ENCOUNTER — Ambulatory Visit (HOSPITAL_COMMUNITY)
Admission: RE | Admit: 2014-11-09 | Discharge: 2014-11-09 | Disposition: A | Discharge status: Home / Self Care | Payer: Medicaid Other | Source: Ambulatory Visit | Attending: Family Medicine | Admitting: Family Medicine

## 2014-11-09 ENCOUNTER — Other Ambulatory Visit (INDEPENDENT_AMBULATORY_CARE_PROVIDER_SITE_OTHER): Payer: Self-pay

## 2014-11-09 ENCOUNTER — Ambulatory Visit (HOSPITAL_BASED_OUTPATIENT_CLINIC_OR_DEPARTMENT_OTHER): Payer: Medicaid Other | Admitting: Family Medicine

## 2014-11-09 ENCOUNTER — Encounter: Payer: Self-pay | Admitting: Family Medicine

## 2014-11-09 VITALS — BP 105/72 | HR 74 | Temp 97.2°F | Resp 16 | Ht <= 58 in | Wt 107.0 lb

## 2014-11-09 DIAGNOSIS — R569 Unspecified convulsions: Secondary | ICD-10-CM

## 2014-11-09 DIAGNOSIS — G40909 Epilepsy, unspecified, not intractable, without status epilepticus: Secondary | ICD-10-CM | POA: Diagnosis not present

## 2014-11-09 DIAGNOSIS — G40311 Generalized idiopathic epilepsy and epileptic syndromes, intractable, with status epilepticus: Secondary | ICD-10-CM

## 2014-11-09 DIAGNOSIS — Z0289 Encounter for other administrative examinations: Secondary | ICD-10-CM

## 2014-11-09 DIAGNOSIS — R609 Edema, unspecified: Secondary | ICD-10-CM

## 2014-11-09 DIAGNOSIS — R600 Localized edema: Secondary | ICD-10-CM | POA: Insufficient documentation

## 2014-11-09 DIAGNOSIS — G40319 Generalized idiopathic epilepsy and epileptic syndromes, intractable, without status epilepticus: Secondary | ICD-10-CM

## 2014-11-09 DIAGNOSIS — G40309 Generalized idiopathic epilepsy and epileptic syndromes, not intractable, without status epilepticus: Secondary | ICD-10-CM

## 2014-11-09 LAB — COMPLETE METABOLIC PANEL WITH GFR
ALBUMIN: 3.6 g/dL (ref 3.6–5.1)
ALT: 8 U/L (ref 6–29)
AST: 17 U/L (ref 10–30)
Alkaline Phosphatase: 70 U/L (ref 33–115)
BUN: 4 mg/dL — ABNORMAL LOW (ref 7–25)
CALCIUM: 8.8 mg/dL (ref 8.6–10.2)
CO2: 25 mmol/L (ref 20–31)
Chloride: 100 mmol/L (ref 98–110)
Creat: 0.56 mg/dL (ref 0.50–1.10)
GFR, Est African American: >89 mL/min (ref >=60)
GFR, Est Non African American: >89 mL/min (ref >=60)
GLUCOSE: 101 mg/dL — AB (ref 65–99)
Potassium: 3.6 mmol/L (ref 3.5–5.3)
Sodium: 133 mmol/L — ABNORMAL LOW (ref 135–146)
Total Bilirubin: 0.3 mg/dL (ref 0.2–1.2)
Total Protein: 6.2 g/dL (ref 6.1–8.1)

## 2014-11-09 LAB — CBC
HEMATOCRIT: 36.2 % (ref 36.0–46.0)
HEMOGLOBIN: 12.3 g/dL (ref 12.0–15.0)
MCH: 30.1 pg (ref 26.0–34.0)
MCHC: 34 g/dL (ref 30.0–36.0)
MCV: 88.7 fL (ref 78.0–100.0)
MPV: 12 fL (ref 8.6–12.4)
Platelets: 256 10*3/uL (ref 150–400)
RBC: 4.08 MIL/uL (ref 3.87–5.11)
RDW: 15.2 % (ref 11.5–15.5)
WBC: 7.5 10*3/uL (ref 4.0–10.5)

## 2014-11-09 MED ORDER — VALPROIC ACID 250 MG/5ML PO SYRP: 500.0000 mg | ORAL_SOLUTION | Freq: Two times a day (BID) | ORAL | Status: DC

## 2014-11-09 NOTE — Patient Instructions (Addendum)
Ms. Douglass,  Thank you for coming in today  1. Swelling: Change depakene dose back to 10 ml (500 mg) twice daily Labs ordered Chest x-ray ordered  You will be called with results  Dr. Armen Pickup

## 2014-11-09 NOTE — Progress Notes (Signed)
Complaining of body swelling  Possible due to medication   Used language resource Nepali Interpreted Bishnu Paudel

## 2014-11-09 NOTE — Progress Notes (Signed)
   Subjective:    Patient ID: Rachel Bush, female    DOB: 1970-04-20, 44 y.o.   MRN: [id number] CC: peripheral edema  HPI 44 yo F with cognitive impairment, history obtained from her sister. Nepali interpreter present   1. Peripheral edema: was diffuse in legs and arms. Now in lower legs and feet only. Started at least 3 weeks ago. No new medications. Only change was increase in depakene from 500 mg to 750 mg BID due to increased seizure activity. Patient and her family did not get message to decrease depakene dose back down to 500 mg BID as level was elevated. Reports improvement in seizures with 2 since last OV. Denies rash, and pruritus.   History  Substance Use Topics  . Smoking status: Current Every Day Smoker    Types: Cigarettes  . Smokeless tobacco: Not on file     Comment: 1-2 cigarettes per day.  . Alcohol Use: No   Review of Systems  Constitutional: Negative for fever and chills.  Respiratory: Negative for shortness of breath.   Cardiovascular: Positive for leg swelling. Negative for chest pain.  Gastrointestinal: Negative for abdominal pain and blood in stool.  Skin: Negative for rash.  Psychiatric/Behavioral: Negative for suicidal ideas and dysphoric mood.      Objective:   Physical Exam BP 105/72 mmHg  Pulse 74  Temp(Src) 97.2 F (36.2 C) (Oral)  Resp 16  Ht  (1.422 m)  Wt 107 lb (48.535 kg)  BMI 24.00 kg/m2  SpO2 87%  Wt Readings from Last 3 Encounters:  11/09/14 107 lb (48.535 kg)  10/25/14 104 lb 6.4 oz (47.356 kg)  09/27/14 103 lb (46.72 kg)  General appearance: alert, cooperative and no distress Throat:  Normal oropharynx Lungs: clear to auscultation bilaterally Heart: regular rate and rhythm, S1, S2 normal, no murmur, click, rub or gallop Extremities: edema in feet and legs 1+ in feet, trace in leg, no rash, no evidence of excoriation     Assessment & Plan:

## 2014-11-09 NOTE — Assessment & Plan Note (Signed)
A: Swelling following increased dose of depakene that is improving. No SOB or airway compromise.  DDx: depakene side effect  P: Change depakene dose back to 10 ml (500 mg) twice daily Labs ordered, pro BNP, CBC, CMP Chest x-ray ordered

## 2014-11-10 LAB — PRO B NATRIURETIC PEPTIDE: Pro B Natriuretic peptide (BNP): 81.91 pg/mL (ref <126)

## 2014-11-11 LAB — LEVETIRACETAM LEVEL: Levetiracetam Lvl: 39.2 ug/mL (ref 10.0–40.0)

## 2014-11-11 LAB — VALPROIC ACID LEVEL, FREE: VALPROIC ACID FREE: 23.8 ug/mL — AB (ref 6.0–22.0)

## 2014-11-11 LAB — VALPROIC ACID LEVEL: VALPROIC ACID LVL: 114 ug/mL — AB (ref 50–100)

## 2014-11-15 ENCOUNTER — Telehealth: Payer: Self-pay | Admitting: Neurology

## 2014-11-15 ENCOUNTER — Other Ambulatory Visit: Payer: Self-pay | Admitting: Neurology

## 2014-11-15 DIAGNOSIS — IMO0002 Reserved for concepts with insufficient information to code with codable children: Secondary | ICD-10-CM

## 2014-11-15 MED ORDER — LACOSAMIDE 100 MG PO TABS: 100.0000 mg | ORAL_TABLET | Freq: Two times a day (BID) | ORAL | Status: DC

## 2014-11-15 NOTE — Telephone Encounter (Signed)
Called patient. No one answered. Mailbox full again. This has been a repeating pattern. Wanted to discuss lab results for AED levels and start a third medication, Vimpat. We will write a letter.   Rachel Bush - ask them to follow up with me in 4 weeks please. Thank you

## 2014-11-16 ENCOUNTER — Ambulatory Visit
Admission: RE | Admit: 2014-11-16 | Discharge: 2014-11-16 | Disposition: A | Discharge status: Home / Self Care | Payer: Medicaid Other | Source: Ambulatory Visit | Attending: Obstetrics & Gynecology | Admitting: Obstetrics & Gynecology

## 2014-11-16 ENCOUNTER — Telehealth: Payer: Self-pay | Admitting: *Deleted

## 2014-11-16 DIAGNOSIS — Z1231 Encounter for screening mammogram for malignant neoplasm of breast: Secondary | ICD-10-CM

## 2014-11-16 NOTE — Telephone Encounter (Signed)
Thank you :)

## 2014-11-16 NOTE — Telephone Encounter (Signed)
Used [employer] Interpreted Nepali # [phone] LVM to return call

## 2014-11-16 NOTE — Telephone Encounter (Signed)
-----   Message from Dessa Phi, MD sent at 11/09/2014  5:15 PM EDT ----- Normal heart size of CXR

## 2014-11-16 NOTE — Telephone Encounter (Signed)
-----   Message from Dessa Phi, MD sent at 11/10/2014  9:08 AM EDT ----- Mild low sodium, otherwise normal labs  Advise decrease depakene to 10 ml BID and decrease water intake by 500 ml

## 2014-11-16 NOTE — Telephone Encounter (Signed)
Faxed Rx Lacosimide (vimpat)  to [facility] pharmacy at [phone]. Received fax confirmation.

## 2014-11-16 NOTE — Telephone Encounter (Addendum)
Spoke w/ pt neighbor, Ram, who is listed on DPR form. I advised him that Dr. Lucia Gaskins would like to start another medication, Vimpat, based on AED lab results. This is a seizure medication. Advised him that she should take 1/2 tablet/day ( ) 2 times per day for one week and then increase to  (1 tablet: 2x/day). Verified Rx will be sent to [facility] pharmacy with him.  I set up 4 week f/u for 12/07/14 at 8:00am for check-in at 7:45am. He verbalized understanding. I had him read back appt date and time to verify he had the correct information.   He wants to add Ganga-sister's number to number's to call. This is [phone]. He wants that to be used and not his number. I updated this for him.

## 2014-11-26 NOTE — Telephone Encounter (Signed)
Nurse called both numbers on chart via [employer], Interpreter [phone]. Nurse left message on voicemail for both numbers provided for Rachel Bush to return call to [facility]. Patients nephew in clinic wanting to make appointment for patient due to not eating, hands/feet/ face swelling.  Patient is having no issues breathing.  Nephew wants to make appointment for patient.  Nephew made appointment for patient to be seen.  Results not given to nephew due to nephew not listed as receiving information.

## 2014-12-02 ENCOUNTER — Encounter: Payer: Self-pay | Admitting: Family Medicine

## 2014-12-02 ENCOUNTER — Ambulatory Visit: Payer: Medicaid Other | Attending: Family Medicine | Admitting: Family Medicine

## 2014-12-02 VITALS — BP 110/73 | HR 74 | Temp 98.3°F | Resp 16 | Ht <= 58 in | Wt 105.0 lb

## 2014-12-02 DIAGNOSIS — R600 Localized edema: Secondary | ICD-10-CM | POA: Diagnosis present

## 2014-12-02 DIAGNOSIS — Z79899 Other long term (current) drug therapy: Secondary | ICD-10-CM | POA: Insufficient documentation

## 2014-12-02 DIAGNOSIS — F17200 Nicotine dependence, unspecified, uncomplicated: Secondary | ICD-10-CM | POA: Insufficient documentation

## 2014-12-02 DIAGNOSIS — R609 Edema, unspecified: Secondary | ICD-10-CM

## 2014-12-02 LAB — URIC ACID: URIC ACID, SERUM: 3 mg/dL (ref 2.4–7.0)

## 2014-12-02 LAB — SEDIMENTATION RATE: SED RATE: 1 mm/h (ref 0–20)

## 2014-12-02 NOTE — Progress Notes (Signed)
   Subjective:    Patient ID: Rachel Bush, female    DOB: 10/13/1970, 44 y.o.   MRN: [id number] CC: f/u peripheral edema  HPI 44 yo F with cognitive impairment and seizures presents for f/u visit with her sister and Nepali interpreter  1. Foot and hand swelling: x one month. Now resolved. Started in setting of increased depakene dose for increased seizure activity. Depakene dose was subsequently decreased. Patient was then started on vimpat per her neurologist for subclinical seizure activity noted on EEG. No tongue or lip swelling. No fever. Mild pruritus associated with swelling of feet and hands. Labs evaluation with CBC, CMP and probnp was unrevealing. CXR revealed possible R pleural effusion, otherwise normal w/o cardiomegaly.   Social History  Substance Use Topics  . Smoking status: Current Every Day Smoker    Types: Cigarettes  . Smokeless tobacco: Not on file     Comment: 1-2 cigarettes per day.  . Alcohol Use: No   Current Outpatient Prescriptions on File Prior to Visit  Medication Sig Dispense Refill  . cetirizine (ZYRTEC) 10 MG tablet Take 1 tablet (10 mg total) by mouth daily. 30 tablet 11  . Lacosamide 100 MG TABS Take 1 tablet (100 mg total) by mouth 2 (two) times daily. 60 tablet 11  . levETIRAcetam (KEPPRA) 100 MG/ML solution Take 15 mLs (1,500 mg total) by mouth 2 (two) times daily. 900 mL 11  . Valproic Acid (DEPAKENE) 250 MG/5ML SYRP syrup Take 10 mLs (500 mg total) by mouth 2 (two) times daily. 900 mL 11   No current facility-administered medications on file prior to visit.   Review of Systems  Constitutional: Negative for fever and chills.  Respiratory: Negative for shortness of breath.   Musculoskeletal: Negative for joint swelling and arthralgias.       Had roughly one month of swelling in hands and feet that has now resolved x 3 days   Skin: Negative for rash.  Allergic/Immunologic: Negative for immunocompromised state.  Hematological: Negative for  adenopathy. Does not bruise/bleed easily.      Objective:   Physical Exam  Constitutional: She is oriented to person, place, and time. She appears well-developed and well-nourished. No distress.  HENT:  Mouth/Throat: Oropharynx is clear and moist.  Cardiovascular: Intact distal pulses.   Pulmonary/Chest: Effort normal and breath sounds normal.  Musculoskeletal: She exhibits no edema or tenderness.  Neurological: She is alert and oriented to person, place, and time.  Skin: Skin is warm and dry. No rash noted.  Psychiatric: She has a normal mood and affect.  BP 110/73 mmHg  Pulse 74  Temp(Src) 98.3 F (36.8 C) (Oral)  Resp 16  Ht  (1.422 m)  Wt 105 lb (47.628 kg)  BMI 23.55 kg/m2  SpO2 98%  LMP 11/27/2014 Wt Readings from Last 3 Encounters:  12/02/14 105 lb (47.628 kg)  11/09/14 107 lb (48.535 kg)  10/25/14 104 lb 6.4 oz (47.356 kg)          Assessment & Plan:

## 2014-12-02 NOTE — Assessment & Plan Note (Signed)
A; peripheral edema has resolved after nearly on month  P: Reassurance Check sed rate and uric acid

## 2014-12-02 NOTE — Progress Notes (Signed)
Complaining of swelling on legs. Swelling goes on and off, no pain  Hx tobacco- 1 cigarette per day   Used language resource interpreter Oneita Hurt

## 2014-12-02 NOTE — Patient Instructions (Addendum)
Thank you for coming in today  1. Swelling in hands and feet: Has now resolved Checking inflammatory markers and uric acid   2. Home health paperwork, our records show that the paperwork was picked up on 10/27/2014. Please call DSS to inquire about receipt of paperwork if they have not received please ask them to send another form.    F/u next month for flu shot with nurse  F/u with me in 3 months, sooner if swelling comes back  Dr. Armen Pickup

## 2014-12-07 ENCOUNTER — Telehealth: Payer: Self-pay | Admitting: *Deleted

## 2014-12-07 ENCOUNTER — Ambulatory Visit: Payer: Self-pay | Admitting: Neurology

## 2014-12-07 NOTE — Telephone Encounter (Signed)
No showed f/u appt.  

## 2014-12-08 ENCOUNTER — Encounter: Payer: Self-pay | Admitting: Neurology

## 2014-12-28 ENCOUNTER — Ambulatory Visit: Payer: Medicaid Other | Admitting: Neurology

## 2015-01-03 ENCOUNTER — Telehealth: Payer: Self-pay | Admitting: Family Medicine

## 2015-01-03 NOTE — Telephone Encounter (Signed)
Pleas call patient PCS form ready for pick up

## 2015-01-03 NOTE — Telephone Encounter (Signed)
LVM to return call   Please let pt know letter in front office ready to be pick up

## 2015-01-11 ENCOUNTER — Ambulatory Visit: Payer: Self-pay | Admitting: Neurology

## 2015-01-26 ENCOUNTER — Ambulatory Visit: Payer: Medicaid Other | Admitting: Neurology

## 2015-02-01 ENCOUNTER — Telehealth: Payer: Self-pay | Admitting: Family Medicine

## 2015-02-01 ENCOUNTER — Ambulatory Visit: Payer: Medicaid Other | Admitting: Neurology

## 2015-02-01 NOTE — Telephone Encounter (Signed)
Patient picked up PCS  form.

## 2015-02-02 ENCOUNTER — Encounter: Payer: Self-pay | Admitting: Neurology

## 2015-02-02 ENCOUNTER — Ambulatory Visit (INDEPENDENT_AMBULATORY_CARE_PROVIDER_SITE_OTHER): Payer: Medicaid Other | Admitting: Neurology

## 2015-02-02 VITALS — BP 107/76 | HR 83 | Ht <= 58 in | Wt 106.8 lb

## 2015-02-02 DIAGNOSIS — G40309 Generalized idiopathic epilepsy and epileptic syndromes, not intractable, without status epilepticus: Secondary | ICD-10-CM

## 2015-02-02 NOTE — Patient Instructions (Addendum)
Remember to drink plenty of fluid, eat healthy meals and do not skip any meals. Try to eat protein with a every meal and eat a healthy snack such as fruit or nuts in between meals. Try to keep a regular sleep-wake schedule and try to exercise daily, particularly in the form of walking, 20-30 minutes a day, if you can.   As far as your medications are concerned, I would like to suggest;  Increase the Vimpat(lacosamide) to 100mg  twice daily  Rachel Bush [address] [place], [place] [id number] Phone: [phone]   I would like to see you back 3 months after you see Dr. Karel JarvisAquino , sooner if we need to. Please call us with any interim questions, concerns, problems, updates or refill requests.   Our phone number is [phone]. We also have an after hours call service for urgent matters and there is a physician on-call for urgent questions. For any emergencies you know to call 911 or go to the nearest emergency room

## 2015-02-02 NOTE — Progress Notes (Signed)
[facility]    Provider: Dr Rachel GaskinsAhern Referring Provider: [name], [name], MD Primary Care Physician: Lora PaulaFUNCHES, [name] C, MD  CC: Seizures  Interval update 02/02/2015: They started the Vimpat but still only 1/2 pill twice a day. Patient has had 2 seizures since last being seen. They are only taking half the tablet, will ask them to increase to the whole tablet twice daily. Will refer to to Patrcia DollyKaren Aquino at [facility] for a second opinion on management of seizure medications. No side effects from the Vimpat. The two seizures happened at night.  This happened one week ago and the second 11 days ago. The seizures lasted 3-5 minutes and she was tired the rest of the day. Otherwise she has been doing well, she has been at her normal baseline. Best phone number to be reached is [phone]  HPI: Rachel Bush is a 44 y.o. female here as a referral from Dr. Armen PickupFunches for seizures. 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 She can recognize her family and follow simple commands, her speech is very limited. She can walk short distances. Previous to March of this year they [place] [facility] [facility]([place] records available). She is here with an interpreter and her sister. Sister provides all the information unfortunately did not live with her in [place] so doesn't have information such as previous baseline of seizures. ED notes state she had a previously baseline of one seizure per day. She was seen in the ED in march for status epilepticus due to subtherapeutic Depakote levels. She is doing well now. She has only had one seizure since last month since her Depakote was increased. She is a little bit more sedated since the increase in medication. She is having some ankle swelling. She is not having staring spells. She does stare but she gets made when someone bothers her.  Reviewed notes, labs and  imaging from outside physicians, which showed: She was seen in the emergency room in the beginning of March. At the time she was on Depakote and Keppra. Notes in the ED state her baseline was approximately one seizure per say that she had been having frequent recurrent seizures over 24 hours. She was on phenobarbital in the past. Depakote level in the emergency room was 0. Patient was in status epilepticus. They did not seek immediate medical attention if there are new to the area. Patient was eventually brought to the emergency room. Patient was given IV Keppra and IV Depakote as well as IV Vimpat. EEG showed clinical seizures without status epilepticus by EEG monitoring. Hospital course was complicated by sepsis secondary to left lung aspiration pneumonia and acute hypoxic respiratory failure. She was discharged on Keppra 100 mg/mL for a total of 1500 mg twice a day and valproic acid 250 mg per 5 mL take 500 mg twice a day. Valproic acid was increased to 750 mg twice daily.   June 20: Valproic acid level 134, Keppra level 51 August 2nd Valproic acid level 114, Keppra level 39.2 trough   She was seen outpatient in June. At that time the family reported she had 10 seizures in the last month.  EEG consistent with moderately severe diffuse encephalopathy as well as seizure disorder with a generalized seizure recorded during the study as well as years to be an epileptogenic focus involving the right hemisphere  Ct head 06/2013: showed No acute intracranial abnormalities including mass lesion or  mass effect, hydrocephalus, extra-axial fluid collection, midline shift, hemorrhage, or acute infarction, large ischemic events (personally reviewed images)  ADDENDUM 11/15/2014:  EEG classification 10/27/2014: Dysrhythmia grade 3 generalized, "slow wave seizures"  Description of the recording: The background rhythms of this recording consists of a disorganized background activity of 5-7 Hz. As the record  progresses, intermittent episodes of delta frequency slowing in a generalized fashion are seen intermixed with sharp and slow-wave complexes that emanate primarily from the right temporal region. Between the episodes of delta slowing, a lower amplitude theta frequency baseline slowing is seen. Photic stimulation and hyperventilation were not performed. EKG monitor shows no evidence of cardiac rhythm abnormalities with a heart rate of 84.  Impression: This is an abnormal EEG recording secondary to diffuse background slowing. The patient appears to have episodes of generalized higher amplitude delta slowing intermixed with sharp and slow-wave complexes emanating primarily from the right hemisphere. These episodes may represent "slow wave seizures" suggesting the patient is having subclinical seizures intermittently during the recording. Clinical correlation is required.  Keppra trough level 39.2, Depakote trough 114. Will add Vimpat   Review of Systems: Patient complains of symptoms per HPI as well as the following symptoms: no cough, no fever, no SOB, no CP. Pertinent negatives per HPI. All others negative.  Social History   Social History  . Marital Status: Single    Spouse Name: N/A  . Number of Children: 0  . Years of Education: None   Occupational History  . Disabled    Social History Main Topics  . Smoking status: Current Every Day Smoker    Types: Cigarettes  . Smokeless tobacco: Not on file     Comment: 1-2 cigarettes per day.  . Alcohol Use: No  . Drug Use: No  . Sexual Activity: No     Comment: Patient has never been sexually active and has no children.  Has never had pap smear, did not tolerate attempted pap smear in office and would likely need anesthesia if pap smear required in future.    Other Topics Concern  . Not on file   Social History Narrative   Lives at home with mother and sister.   Right-handed.   One cup caffeine daily.      Per sister:   Patient has  never been sexually active and has no children.  Has never had pap smear, did not tolerate attempted pap smear in office and would likely need anesthesia if pap smear required in future (for abnormal bleeding, etc).    Family History  Problem Relation Age of Onset  . Diabetes Mother   . Hypertension Father     Past Medical History  Diagnosis Date  . Seizures (HCC)     since baby    Past Surgical History  Procedure Laterality Date  . No past surgery      Current Outpatient Prescriptions  Medication Sig Dispense Refill  . levETIRAcetam (KEPPRA) 100 MG/ML solution Take 15 mLs (1,500 mg total) by mouth 2 (two) times daily. (Patient taking differently: Take 1,500 mg by mouth 2 (two) times daily. Pt family member states they think they take this medication "but forgot the medications" and not sure.) 900 mL 11  . Valproic Acid (DEPAKENE) 250 MG/5ML SYRP syrup Take 10 mLs (500 mg total) by mouth 2 (two) times daily. 900 mL 11  . cetirizine (ZYRTEC) 10 MG tablet Take 1 tablet (10 mg total) by mouth daily. (Patient taking differently: Take 10 mg by mouth  daily. Pt family member states she did take something for allergies but does not remember what it was) 30 tablet 11  . Lacosamide 100 MG TABS Take 1 tablet (100 mg total) by mouth 2 (two) times daily. 60 tablet 11   No current facility-administered medications for this visit.    Allergies as of 02/02/2015  . (No Known Allergies)    Vitals: BP 107/76 mmHg  Pulse 83  Ht  (1.422 m)  Wt 106 lb 12.8 oz (48.444 kg)  BMI 23.96 kg/m2 Last Weight:  Wt Readings from Last 1 Encounters:  02/02/15 106 lb 12.8 oz (48.444 kg)   Last Height:   Ht Readings from Last 1 Encounters:  02/02/15  (1.422 m)    Physical exam: Exam: Gen: NAD, not conversant  CV: RRR, no MRG. No Carotid Bruits. No peripheral edema, warm, nontender Eyes: without exudates or hemorrhage  Neuro: Detailed Neurologic Exam  Speech:   Largey Non verbal but is interactive and responds to voice, nods to questions and smiles Cognition: follow simple commands  The patient is oriented only to name according to sister and recognizes her family members  recent and remote memory intact impaired;   impaired mal attention, concentration,   fund of knowledge impaired Cranial Nerves:  The pupils are equal, round, and reactive to light. Attempted fundoscopiuc exam unable. Visual fields are full to threat. Extraocular movements are intact. Trigeminal sensation is intact and the muscles of mastication are normal. The face is symmetric. The palate elevates in the midline. Hearing intact. Voice is normal. Shoulder shrug is normal. The tongue has normal motion without fasciculations.   Coordination:  Difficult to test due to cognitive impairment but no apparent dysmetria   Motor Observation:  no involuntary movements noted. Tone:  Increased right arm   Posture:  Posture is normal. normal erect   Strength:  Antigravity, difficult full exam due to cognition,    Sensation: intact to LT   Reflex Exam:  DTR's: appear brisker on the right   Toes:  The toes are downgoing bilaterally.  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 She can recognize her family and follow simple commands, her speech is very limited. No seizures since June after her primary care increased her valproic acid. Notes state that valproic acid 750 mg twice a day. Also on Keppra 1500 mg twice a day. Need records from neurologist in [place]. Order labs, AED levels. EEG.  Addendum 11/15/2014 Impression: This is an abnormal EEG recording secondary to diffuse background slowing. The patient appears to have episodes of generalized higher amplitude delta slowing intermixed with sharp and  slow-wave complexes emanating primarily from the right hemisphere. These episodes may represent "slow wave seizures" suggesting the patient is having subclinical seizures intermittently during the recording. Clinical correlation is required.  Keppra trough level 39.2, Depakote trough 114. Will add Vimpat  twice daily. Will refer to Dr. Patrcia Dolly for second opinion on medication management.  Naomie Dean, MD  [facility] [address] [place], [place] [address]  Phone [phone] Fax [phone]  A total of 30 minutes was spent face-to-face with this patient. Over half this time was spent on counseling patient on the seizure diagnosis and different diagnostic and therapeutic options available.

## 2015-03-24 ENCOUNTER — Ambulatory Visit: Payer: Medicaid Other | Admitting: Neurology

## 2015-03-25 ENCOUNTER — Encounter: Payer: Self-pay | Admitting: Neurology

## 2015-03-25 ENCOUNTER — Ambulatory Visit (INDEPENDENT_AMBULATORY_CARE_PROVIDER_SITE_OTHER): Payer: Medicaid Other | Admitting: Neurology

## 2015-03-25 VITALS — BP 90/72 | HR 84 | Resp 14 | Wt 106.0 lb

## 2015-03-25 DIAGNOSIS — G40219 Localization-related (focal) (partial) symptomatic epilepsy and epileptic syndromes with complex partial seizures, intractable, without status epilepticus: Secondary | ICD-10-CM

## 2015-03-25 NOTE — Progress Notes (Signed)
NEUROLOGY CONSULTATION NOTE  Rachel GaskinsDamanta Bush MRN: [id number] DOB: 21-Feb-1971  Referring provider: Dr. Naomie DeanAntonia Ahern Primary care provider: Dr. Dessa PhiJosalyn Funches  Reason for consult:  seizures  Dear Dr Rachel GaskinsAhern:  Thank you for your kind referral of Rachel Bush for consultation of the above symptoms. Although her history is well known to you, please allow me to reiterate it for the purpose of our medical record. The patient was accompanied to the clinic by her sister who also provides collateral information. A [place]alese medical interpreter helps with translation. Records and images were personally reviewed where available.  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 Her sister provides the history due to patient's cognitive status. She reports that seizures started around a week after birth. As far as her sister knows, she had a normal delivery. There is no family history of seizures. She describes seizures starting with staring and unresponsiveness, her eyes look like they are moving around, then both hands curl up and she becomes stiff with shaking. She would "vocalize like a goat." She would be very tired after a convulsion. She had been living in [place] where she apparently was having more seizures and side effects to unrecalled medications, but since moving here and adjusting medications, seizure control has been better. In the past, she would have seizures 2-3 times a week, longest seizure-free interval has been 2 months.   Records on [facility]IC were reviewed. [facility] in status epilepticus, per ER notes family reported she was having seizures all night and had 3 seizures witnessed by EMS. She was reported to be taking Depakote and Keppra, however Depakote level was <10. Keppra level was 71 (ref 10-40). She was continued  on Keppra and Depakote, with Depakote load given, and started on Vimpat. I personally reviewed head CT which did not show any acute changes. There was cerebellar atrophy seen and small calcifications in the left frontal region. EEG showed diffuse slowing, with frequent sharp waves over the right hemisphere with phase reversals primarily in the frontal and parietal regions. Occasional generalized sharp wave discharges were seen. She had a clinical seizure with EEG showing initially an increase in focal as well as generalized sharp wave discharges followed by fairly abrupt onset of diffuse rhythmic 12 Hz activity which rapidly became almost obscured by motor activity. The rhythmic fast activity rapidly gave way to rhythmic diffuse slow-wave activity for several seconds, then return to baseline. She continued to improve, however was noted to have poor appetite and nausea, Vimpat was held and Keppra and Depakote maximized. She was discharged home on Keppra 1500mg  BID and Depakote 500mg  BID. She was seen by her PCP in June, with report of 10 GTCs in 6 weeks. Depakote increased to 750mg  BID, however Depakote level was 134.2 and dose reduced back to 500mg  BID. Repeat trough levels in August showed a Depakote level of 114, Keppra level 39.2. [facility] which reported Dysrhthmia grade 3 generalized, "slow wave seizures," with disorganized background activity of 5-7 Hz. As the record progresses, intermittent episodes of delta frequency slowing in a generalized fashion are seen intermixed with sharp and slow wave complexes that emanate primarily from the right temporal region. Between episodes of delta slowing, a lower amplitude theta frequency baseline slowing is seen. There was concern that the episodes of generalized higher  amplitude delta intermixed with sharp and slow wave complexes may represent "slow wave seizures" suggesting subclinical seizures. Vimpat was added back, and increased to  BID last  October. Her family denies any GTCs since 01/27/15. She is tolerating addition of Vimpat better this time. Family also reports staring episodes occurring 1-2 times a week, last episode was 3 days ago.   Her sister reports that at baseline, she can speak but in "slow motion" that only her sister can understand. She can sometimes answer her sister immediately, other times there is delay. She is dependent on all ADLs, including eating. She can walk short distances but needs her sister to assist her.   Epilepsy Risk Factors:  Severe cognitive impairment with left-sided hemiparesis. Per sister, she had a normal delivery but had developmental delay. As far as her sister knows, there is no history of febrile convulsions, CNS infections such as meningitis/encephalitis, significant traumatic brain injury, neurosurgical procedures, or family history of seizures.  Prior AEDs: Phenobarbital Laboratory Data:  Lab Results  Component Value Date   WBC 7.5 11/09/2014   HGB 12.3 11/09/2014   HCT 36.2 11/09/2014   MCV 88.7 11/09/2014   PLT 256 11/09/2014     Chemistry      Component Value Date/Time   NA 133* 11/09/2014 1303   NA 136 10/27/2014 1608   K 3.6 11/09/2014 1303   CL 100 11/09/2014 1303   CO2 25 11/09/2014 1303   BUN 4* 11/09/2014 1303   BUN 5* 10/27/2014 1608   CREATININE 0.56 11/09/2014 1303   CREATININE 0.78 10/27/2014 1608      Component Value Date/Time   CALCIUM 8.8 11/09/2014 1303   ALKPHOS 70 11/09/2014 1303   AST 17 11/09/2014 1303   ALT 8 11/09/2014 1303   BILITOT 0.3 11/09/2014 1303   BILITOT 0.2 10/27/2014 1608      PAST MEDICAL HISTORY: Past Medical History  Diagnosis Date  . Seizures (HCC)     since baby    PAST SURGICAL HISTORY: Past Surgical History  Procedure Laterality Date  . No past surgery      MEDICATIONS: Current Outpatient Prescriptions on File Prior to Visit  Medication Sig Dispense Refill  . Lacosamide 100 MG TABS Take 1 tablet (100 mg total) by  mouth 2 (two) times daily. 60 tablet 11  . levETIRAcetam (KEPPRA) 100 MG/ML solution Take 15 mLs (1,500 mg total) by mouth 2 (two) times daily. (Patient taking differently: Take 1,500 mg by mouth 2 (two) times daily. Pt family member states they think they take this medication "but forgot the medications" and not sure.) 900 mL 11  . Valproic Acid (DEPAKENE) 250 MG/5ML SYRP syrup Take 10 mLs (500 mg total) by mouth 2 (two) times daily. 900 mL 11  . cetirizine (ZYRTEC) 10 MG tablet Take 1 tablet (10 mg total) by mouth daily. (Patient taking differently: Take 10 mg by mouth daily. Pt family member states she did take something for allergies but does not remember what it was) 30 tablet 11   No current facility-administered medications on file prior to visit.    ALLERGIES: No Known Allergies  FAMILY HISTORY: Family History  Problem Relation Age of Onset  . Diabetes Mother   . Hypertension Father     SOCIAL HISTORY: Social History   Social History  . Marital Status: Single    Spouse Name: N/A  . Number of Children: 0  . Years of Education: None   Occupational History  . Disabled  Social History Main Topics  . Smoking status: Current Every Day Smoker    Types: Cigarettes  . Smokeless tobacco: Never Used     Comment: 1-2 cigarettes per day.  . Alcohol Use: No  . Drug Use: No  . Sexual Activity: No     Comment: Patient has never been sexually active and has no children.  Has never had pap smear, did not tolerate attempted pap smear in office and would likely need anesthesia if pap smear required in future.    Other Topics Concern  . Not on file   Social History Narrative   Lives at home with mother and sister.   Right-handed.   One cup caffeine daily.      Per sister:   Patient has never been sexually active and has no children.  Has never had pap smear, did not tolerate attempted pap smear in office and would likely need anesthesia if pap smear required in future (for  abnormal bleeding, etc).    REVIEW OF SYSTEMS: unable to obtain due to mental status  PHYSICAL EXAM: Filed Vitals:   03/25/15 1028  BP: 90/72  Pulse: 84  Resp: 14   General: No acute distress, no verbal output in office today, does not follow commands, smiles or nods to sister Head:  Normocephalic/atraumatic Eyes: unable to perform Neck: supple, full range of motion Back: No paraspinal tenderness Heart: regular rate and rhythm Lungs: Clear to auscultation bilaterally. Vascular: No carotid bruits. Skin/Extremities: No rash, no edema Neurological Exam: Mental status: awake and alert, non-verbal, does not follow commands,  Cranial nerves: CN I: not tested CN II: pupils equal, round and reactive to light, +blink to threat bilaterally, unable to visualize fundi CN III, IV, VI:  full range of motion, no nystagmus, no ptosis CN VII: upper and lower face symmetric CN XI: sternocleidomastoid and trapezius muscles intact CN XII: tongue midline Bulk & Tone: normal, no fasciculations. Motor: unable to do formal motor testing, but appears to have mild left-sided weakness, more notable on ambulation Sensation: intact to light touch Deep Tendon Reflexes: +2 throughout Plantar responses: downgoing bilaterally Cerebellar: no incoordination on reaching for objects Gait: slow and cautious, needs assistance, appears to favor left leg Tremor: none  IMPRESSION: This is a 44 year old woman with severe cognitive impairment, mild left hemiparesis, and seizures since infancy, presenting for second opinion of seizures. Her sister describes staring spells and GTCs, EEGs have shown diffuse slowing, frequent right hemisphere epileptiform discharges, as well as generalized discharges, suggestive of symptomatic generalized epilepsy. She is currently on Keppra  BID, Depakote  BID, and Vimpat  BID, with no convulsions in the past 2 months. She continues to have staring spells 1-2 times a week.  A repeat 1-hour EEG will be ordered to assess for subclinical seizures, however I discussed with her sister that we will need to find the right balance between seizure control and side effects. Continue current medications at this time. She will follow-up in 3 months or earlier if needed.   Thank you for allowing me to participate in the care of this patient. Please do not hesitate to call for any questions or concerns.   Patrcia Dolly, M.D.  CC: Dr. Lucia Bush, Dr. Armen Pickup

## 2015-03-25 NOTE — Patient Instructions (Signed)
1. Schedule 1-hour EEG (not sleep deprived) 2. Continue all your medications 3. Follow-up in 3 months

## 2015-04-04 DIAGNOSIS — G40219 Localization-related (focal) (partial) symptomatic epilepsy and epileptic syndromes with complex partial seizures, intractable, without status epilepticus: Secondary | ICD-10-CM | POA: Insufficient documentation

## 2015-04-13 ENCOUNTER — Ambulatory Visit (INDEPENDENT_AMBULATORY_CARE_PROVIDER_SITE_OTHER): Payer: Medicaid Other | Admitting: Neurology

## 2015-04-13 DIAGNOSIS — G40219 Localization-related (focal) (partial) symptomatic epilepsy and epileptic syndromes with complex partial seizures, intractable, without status epilepticus: Secondary | ICD-10-CM

## 2015-04-14 ENCOUNTER — Other Ambulatory Visit: Payer: Medicaid Other

## 2015-05-04 ENCOUNTER — Encounter: Payer: Self-pay | Admitting: *Deleted

## 2015-05-04 NOTE — Progress Notes (Signed)
Faxed completed personal care services transition form back to [facility]. Received confirmation: [phone].

## 2015-05-18 ENCOUNTER — Ambulatory Visit: Payer: Medicaid Other | Admitting: Neurology

## 2015-05-26 NOTE — Procedures (Signed)
ELECTROENCEPHALOGRAM REPORT  Date of Study: 04/13/2015  Patient's Name: Rachel Bush MRN: [id number] Date of Birth: [birth year]  Referring Provider: Dr. Patrcia Dolly  Clinical History: This is a 45 year old woman with severe cognitive impairment, mild left hemiparesis, and seizures since infancy, with staring spells and GTCs. She continues to have staring spells 1-2 times a week  Medications: Keppra  BID, Depakote  BID, and Vimpat  BID  Technical Summary: A multichannel digital 1-hour EEG recording measured by the international 10-20 system with electrodes applied with paste and impedances below 5000 ohms performed as portable with EKG monitoring in an awake and asleep patient.  Hyperventilation and photic stimulation were performed.  The digital EEG was referentially recorded, reformatted, and digitally filtered in a variety of bipolar and referential montages for optimal display.   Description: The patient is awake and asleep during the recording.  During maximal wakefulness, there is a symmetric, medium voltage 7 Hz posterior dominant rhythm that attenuates with eye opening. This is admixed with a small amount of diffuse 4-5 Hz theta slowing of the waking background.  During drowsiness and sleep, there is an increase in theta slowing of the background, with occasional vertex waves seen.  Hyperventilation and photic stimulation did not elicit any abnormalities.  There were occasional independent sharp waves seen over the bilateral frontal regions, left greater than right. There were no electrographic seizures seen.    EKG lead was unremarkable.  Impression: This 1-hour awake and asleep EEG is abnormal due to the presence of: 1. Slowing of the posterior dominant rhythm 2. Mild diffuse slowing of the waking background 3. Occasional epileptiform discharges seen independently over the bilateral frontal regions, left greater than right  Clinical Correlation of the above  findings indicates diffuse cerebral dysfunction that is non-specific in etiology and can be seen with hypoxic/ischemic injury, toxic/metabolic encephalopathies, or medication effect. Sharp waves seen independently over the bilateral frontal regions indicate a tendency for seizures to arise from these regions.   Patrcia Dolly, M.D.

## 2015-06-18 ENCOUNTER — Other Ambulatory Visit: Payer: Self-pay | Admitting: Family Medicine

## 2015-06-22 ENCOUNTER — Telehealth: Payer: Self-pay | Admitting: Family Medicine

## 2015-06-22 DIAGNOSIS — G40319 Generalized idiopathic epilepsy and epileptic syndromes, intractable, without status epilepticus: Secondary | ICD-10-CM

## 2015-06-22 DIAGNOSIS — G40309 Generalized idiopathic epilepsy and epileptic syndromes, not intractable, without status epilepticus: Secondary | ICD-10-CM

## 2015-06-22 NOTE — Telephone Encounter (Signed)
Pt. Came in to the facility requesting a med refill on the following medications:   Valproic Acid (DEPAKENE) 250 MG/5ML SYRP syrup levETIRAcetam (KEPPRA) 100 MG/ML solution  Please f/u with pt.

## 2015-06-23 MED ORDER — VALPROATE SODIUM 250 MG/5ML PO SYRP: 500.0000 mg | ORAL_SOLUTION | Freq: Two times a day (BID) | ORAL | Status: DC

## 2015-06-23 MED ORDER — LEVETIRACETAM 100 MG/ML PO SOLN: ORAL | Status: DC

## 2015-06-23 NOTE — Telephone Encounter (Signed)
meds refilled Reviewed neurology note from 03/2015. Patient to f/u with me in 3 months, sooner as needed for seizures or concerns.

## 2015-06-27 NOTE — Telephone Encounter (Signed)
[employer]#

## 2015-06-27 NOTE — Telephone Encounter (Signed)
Used [employer] [phone]#[id number] Unable to contact pt, no voice mail set up  Mail communication letter to pt

## 2015-07-14 ENCOUNTER — Encounter: Payer: Self-pay | Admitting: Neurology

## 2015-07-14 ENCOUNTER — Ambulatory Visit (INDEPENDENT_AMBULATORY_CARE_PROVIDER_SITE_OTHER): Payer: Medicaid Other | Admitting: Neurology

## 2015-07-14 VITALS — BP 100/70 | HR 84 | Resp 14 | Wt 109.0 lb

## 2015-07-14 DIAGNOSIS — G40219 Localization-related (focal) (partial) symptomatic epilepsy and epileptic syndromes with complex partial seizures, intractable, without status epilepticus: Secondary | ICD-10-CM | POA: Diagnosis not present

## 2015-07-14 DIAGNOSIS — G40309 Generalized idiopathic epilepsy and epileptic syndromes, not intractable, without status epilepticus: Secondary | ICD-10-CM

## 2015-07-14 DIAGNOSIS — G40319 Generalized idiopathic epilepsy and epileptic syndromes, intractable, without status epilepticus: Secondary | ICD-10-CM | POA: Diagnosis not present

## 2015-07-14 DIAGNOSIS — IMO0002 Reserved for concepts with insufficient information to code with codable children: Secondary | ICD-10-CM

## 2015-07-14 MED ORDER — LACOSAMIDE 100 MG PO TABS: ORAL_TABLET | ORAL | Status: DC

## 2015-07-14 MED ORDER — VALPROATE SODIUM 250 MG/5ML PO SYRP: 500.0000 mg | ORAL_SOLUTION | Freq: Two times a day (BID) | ORAL | Status: DC

## 2015-07-14 MED ORDER — LEVETIRACETAM 100 MG/ML PO SOLN: ORAL | Status: DC

## 2015-07-14 NOTE — Patient Instructions (Addendum)
1. Increase Vimpat 100mg : Take 1 & 1/2 tablets twice a day 2. Continue Depakote and Keppra  3. Follow-up in 5 months, call for any problems   1 Vr?d'dhi Vimpat 100mg : ?ka dina du'? pa?aka 1 ra 1/2 ?y?bl??ak? linuh?s  2. Depakote ra Keppra j?r?  3. 5 Mahin?m? phal?-apa, kunai pani samasy?k? l?gi kala   Seizure Precautions: 1. If medication has been prescribed for you to prevent seizures, take it exactly as directed.  Do not stop taking the medicine without talking to your doctor first, even if you have not had a seizure in a long time.   2. Avoid activities in which a seizure would cause danger to yourself or to others.  Don't operate dangerous machinery, swim alone, or climb in high or dangerous places, such as on ladders, roofs, or girders.  Do not drive unless your doctor says you may.  3. If you have any warning that you may have a seizure, lay down in a safe place where you can't hurt yourself.    4.  No driving for 6 months from last seizure, [place].   Please refer to the following link on the Epile[employer] of America's website for more information: http://www.epilepsyfoundation.org/answerplace/Social/driving/drivingu.cfm   5.  Maintain good sleep hygiene. Avoid alcohol  6.  Notify your neurology if you are planning pregnancy or if you become pregnant.  7.  Contact your doctor if you have any problems that may be related to the medicine you are taking.  8.  Call 911 and bring the patient back to the ED if:        A.  The seizure lasts longer than 5 minutes.       B.  The patient doesn't awaken shortly after the seizure  C.  The patient has new problems such as difficulty seeing, speaking or moving  D.  The patient was injured during the seizure  E.  The patient has a temperature over 102 F (39C)  F.  The patient vomited and now is having trouble breathing

## 2015-07-14 NOTE — Progress Notes (Signed)
NEUROLOGY FOLLOW UP OFFICE NOTE  Rachel GaskinsDamanta Downard [id number]  HISTORY OF PRESENT ILLNESS: I had the pleasure of seeing Rachel GaskinsDamanta Searcy in follow-up in the neurology clinic on 07/14/2015.  The patient was last seen 3 months ago for symptomatic generalized epilepsy. She is again accompanied by her sister who helps supplement the history today. A [place] medical translator helps with translation.  Records and images were personally reviewed where available. Her 1-hour EEG was abnormal due to mild diffuse slowing, and occasional epileptiform discharges seen independently over the bilateral frontal regions, left greater than right. Previous EEG reported "slow wave seizures" suggesting subclinical seizures, primarily from the right temporal region.  She has been taking Keppra 1500mg  BID, Depakote 500mg  BID, and Vimpat 100mg  BID. Family was reporting staring spells 1-2 times a week and a convulsion in October 2016. Since her last visit, her sister reports 2 convulsions out of sleep, one on 04/08/15 and another on 06/13/15. She feels the seizures are better, although reporting a 10-minute convulsion, her sister reports a shorter recovery time. She denies any episodes of staring since her last visit. Her sister denies any side effects on medications.   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 Her sister provides the history due to patient's cognitive status. She reports that seizures started around a week after birth. As far as her sister knows, she had a normal delivery. There is no family history of seizures. She describes seizures starting with staring and unresponsiveness, her eyes look like they are moving around, then both hands curl up and she becomes stiff with shaking. She would "vocalize like a goat." She would be very tired after a convulsion. She had been living in [place]  where she apparently was having more seizures and side effects to unrecalled medications, but since moving here and adjusting medications, seizure control has been better. In the past, she would have seizures 2-3 times a week, longest seizure-free interval has been 2 months.   Records on EPIC were reviewed. She was admitted to [facility] last March 2016 in status epilepticus, per ER notes family reported she was having seizures all night and had 3 seizures witnessed by EMS. She was reported to be taking Depakote and Keppra, however Depakote level was <10. Keppra level was 71 (ref 10-40). She was continued on Keppra and Depakote, with Depakote load given, and started on Vimpat. I personally reviewed head CT which did not show any acute changes. There was cerebellar atrophy seen and small calcifications in the left frontal region. EEG showed diffuse slowing, with frequent sharp waves over the right hemisphere with phase reversals primarily in the frontal and parietal regions. Occasional generalized sharp wave discharges were seen. She had a clinical seizure with EEG showing initially an increase in focal as well as generalized sharp wave discharges followed by fairly abrupt onset of diffuse rhythmic 12 Hz activity which rapidly became almost obscured by motor activity. The rhythmic fast activity rapidly gave way to rhythmic diffuse slow-wave activity for several seconds, then return to baseline. She continued to improve, however was noted to have poor appetite and nausea, Vimpat was held and Keppra and Depakote maximized. She was discharged home on Keppra 1500mg  BID and Depakote 500mg  BID. She was seen by her PCP in June, with report of 10 GTCs in 6 weeks. Depakote increased to 750mg  BID, however Depakote level was 134.2  and dose reduced back to  BID. Repeat trough levels in August showed a Depakote level of 114, Keppra level 39.2. She had an EEG at [facility] which reported Dysrhthmia grade 3 generalized, "slow wave  seizures," with disorganized background activity of 5-7 Hz. As the record progresses, intermittent episodes of delta frequency slowing in a generalized fashion are seen intermixed with sharp and slow wave complexes that emanate primarily from the right temporal region. Between episodes of delta slowing, a lower amplitude theta frequency baseline slowing is seen. There was concern that the episodes of generalized higher amplitude delta intermixed with sharp and slow wave complexes may represent "slow wave seizures" suggesting subclinical seizures. Vimpat was added back, and increased to  BID last October. Her family denies any GTCs since 01/27/15. She is tolerating addition of Vimpat better this time. Family also reports staring episodes occurring 1-2 times a week, last episode was 3 days ago.   Her sister reports that at baseline, she can speak but in "slow motion" that only her sister can understand. She can sometimes answer her sister immediately, other times there is delay. She is dependent on all ADLs, including eating. She can walk short distances but needs her sister to assist her.   Epilepsy Risk Factors: Severe cognitive impairment with left-sided hemiparesis. Per sister, she had a normal delivery but had developmental delay. As far as her sister knows, there is no history of febrile convulsions, CNS infections such as meningitis/encephalitis, significant traumatic brain injury, neurosurgical procedures, or family history of seizures.  Prior AEDs: Phenobarbital  PAST MEDICAL HISTORY: Past Medical History  Diagnosis Date  . Seizures (HCC)     since baby    MEDICATIONS: Current Outpatient Prescriptions on File Prior to Visit  Medication Sig Dispense Refill  . cetirizine (ZYRTEC) 10 MG tablet Take 1 tablet (10 mg total) by mouth daily. (Patient taking differently: Take 10 mg by mouth daily. Pt family member states she did take something for allergies but does not remember what it was) 30  tablet 11  . Lacosamide 100 MG TABS Take 1 tablet (100 mg total) by mouth 2 (two) times daily. 60 tablet 11  . levETIRAcetam (KEPPRA) 100 MG/ML solution TAKE BY MOUTH TWICE DAILY 900 mL 5  . valproic acid (DEPAKENE) 250 MG/5ML syrup Take 10 mLs (500 mg total) by mouth 2 (two) times daily. 600 mL 5   No current facility-administered medications on file prior to visit.    ALLERGIES: No Known Allergies  FAMILY HISTORY: Family History  Problem Relation Age of Onset  . Diabetes Mother   . Hypertension Father     SOCIAL HISTORY: Social History   Social History  . Marital Status: Single    Spouse Name: N/A  . Number of Children: 0  . Years of Education: None   Occupational History  . Disabled    Social History Main Topics  . Smoking status: Current Every Day Smoker    Types: Cigarettes  . Smokeless tobacco: Never Used     Comment: 1-2 cigarettes per day.  . Alcohol Use: No  . Drug Use: No  . Sexual Activity: No     Comment: Patient has never been sexually active and has no children.  Has never had pap smear, did not tolerate attempted pap smear in office and would likely need anesthesia if pap smear required in future.    Other Topics Concern  . Not on file   Social History Narrative   Lives at home  with mother and sister.   Right-handed.   One cup caffeine daily.      Per sister:   Patient has never been sexually active and has no children.  Has never had pap smear, did not tolerate attempted pap smear in office and would likely need anesthesia if pap smear required in future (for abnormal bleeding, etc).    REVIEW OF SYSTEMS unable to obtain due to cognitive impairment, minimal verbal output  PHYSICAL EXAM: Filed Vitals:   07/14/15 1002  BP: 100/70  Pulse: 84  Resp: 14   General: No acute distress, smiles and waves when I enter and leave room, tries to follow simple commands, minimal verbal output when asked to count fingers (but gives wrong  response) Head: Normocephalic/atraumatic Eyes: unable to perform fundoscopy Neck: supple, full range of motion Back: No paraspinal tenderness Heart: regular rate and rhythm Lungs: Clear to auscultation bilaterally. Vascular: No carotid bruits. Skin/Extremities: No rash, no edema Neurological Exam: Mental status: awake and alert, as above  Cranial nerves: CN I: not tested CN II: pupils equal, round and reactive to light, +blink to threat bilaterally, unable to visualize fundi CN III, IV, VI: full range of motion, no nystagmus, no ptosis CN VII: upper and lower face symmetric CN XI: sternocleidomastoid and trapezius muscles intact CN XII: tongue midline Bulk & Tone: normal, no fasciculations. Motor: unable to do formal motor testing, but appears to have mild left-sided weakness, more notable on ambulation on left leg (similar to prior) Sensation: intact to light touch Deep Tendon Reflexes: +2 throughout Plantar responses: downgoing bilaterally Cerebellar: no incoordination on reaching for objects Gait: slow and cautious, needs assistance, appears to favor left leg (similar to prior) Tremor: none  IMPRESSION: This is a 45 yo woman with severe cognitive impairment, mild left hemiparesis, and seizures since infancy suggestive of symptomatic generalized epilepsy. Her sister describes staring spells and GTCs, EEGs have shown diffuse slowing, frequent right hemisphere epileptiform discharges, as well as generalized discharges. A previous EEG was concerning for subclinical seizures, her recent 1-hour EEG did not show any electrographic seizures, there were independent epileptiform discharges over the frontal regions, left greater than right. Her sister denies any staring episodes since her last visit. She has had 2 convulsions in the past 3 months. Increase Vimpat to  BID. Continue liquid Keppra  BID and Depakote  BID. She will follow-up in 5 months, her sister knows to call for  any changes.   Thank you for allowing me to participate in her care.  Please do not hesitate to call for any questions or concerns.  The duration of this appointment visit was 25 minutes of face-to-face time with the patient.  Greater than 50% of this time was spent in counseling, explanation of diagnosis, planning of further management, and coordination of care.   Patrcia Dolly, M.D.   CC: Dr. Armen Pickup

## 2015-07-20 ENCOUNTER — Emergency Department (HOSPITAL_COMMUNITY)
Admission: EM | Admit: 2015-07-20 | Discharge: 2015-07-21 | Disposition: A | Discharge status: Home / Self Care | Payer: Medicaid Other | Attending: Emergency Medicine | Admitting: Emergency Medicine

## 2015-07-20 ENCOUNTER — Encounter (HOSPITAL_COMMUNITY): Payer: Self-pay | Admitting: Emergency Medicine

## 2015-07-20 DIAGNOSIS — R112 Nausea with vomiting, unspecified: Secondary | ICD-10-CM | POA: Diagnosis present

## 2015-07-20 DIAGNOSIS — F17210 Nicotine dependence, cigarettes, uncomplicated: Secondary | ICD-10-CM | POA: Diagnosis not present

## 2015-07-20 DIAGNOSIS — Z79899 Other long term (current) drug therapy: Secondary | ICD-10-CM | POA: Diagnosis not present

## 2015-07-20 MED ORDER — ONDANSETRON 8 MG PO TBDP
8.0000 mg | ORAL_TABLET | Freq: Once | ORAL | Status: AC
  Administered 2015-07-20: 8 mg via ORAL
  Filled 2015-07-20: qty 1

## 2015-07-20 NOTE — ED Notes (Addendum)
Per [facility], pt was fine yesterday but started feeling bad this morning and around 7pm started throwing up, threw up 5 times.

## 2015-07-20 NOTE — ED Notes (Signed)
Per EMS pt had 4 episodes of vomiting tonight, no vomiting with EMS

## 2015-07-20 NOTE — ED Provider Notes (Signed)
CSN: [id number]     Arrival date & time 07/20/15  2241 History  By signing my name below, I, Ronney LionSuzanne Le, attest that this documentation has been prepared under the direction and in the presence of Elpidio AnisShari Dura Mccormack, PA-C. Electronically Signed: Ronney LionSuzanne Le, ED Scribe. 07/20/2015. 11:16 PM.    Chief Complaint  Patient presents with  . Emesis   The history is provided by a relative. A language interpreter was used [employer](Nephew acts as interpreter.).    HPI Comments: Rachel Bush is a 45 y.o. female with a history of seizures, who presents to the Emergency Department, brought in by ambulance, complaining of 5 episodes of constant, moderate emesis since this morning. Patient's nephew states she was last well yesterday but started to feel nauseated this morning. Her nephew states patient has had sick contact with his mother, patient's sister, has had similar symptoms. Her nephew denies a history of any surgeries or operations. Patient has only been able to eat a little bit of bread and coffee this morning. He states she vomited 1 hour after taking her seizure medications (Keppra and Depakene, per medical records). Her nephew denies fever, diarrhea, or pain.    Past Medical History  Diagnosis Date  . Seizures (HCC)     since baby   Past Surgical History  Procedure Laterality Date  . No past surgery     Family History  Problem Relation Age of Onset  . Diabetes Mother   . Hypertension Father    Social History  Substance Use Topics  . Smoking status: Current Every Day Smoker    Types: Cigarettes  . Smokeless tobacco: Never Used     Comment: 1-2 cigarettes per day.  . Alcohol Use: No   OB History    No data available     Review of Systems  Constitutional: Negative for fever.  Respiratory: Negative for cough and shortness of breath.   Cardiovascular: Negative for chest pain.  Gastrointestinal: Positive for nausea and vomiting. Negative for abdominal pain and diarrhea.  Musculoskeletal:  Negative for myalgias.  Neurological: Negative for seizures and syncope.  All other systems reviewed and are negative.  Allergies  Review of patient's allergies indicates no known allergies.  Home Medications   Prior to Admission medications   Medication Sig Start Date End Date Taking? Authorizing Provider  Lacosamide 100 MG TABS Take 1 & 1/2 tablets twice a day 07/14/15   Van ClinesKaren M Aquino, MD  levETIRAcetam (KEPPRA) 100 MG/ML solution TAKE 15MLS BY MOUTH TWICE DAILY 07/14/15   Van ClinesKaren M Aquino, MD  valproic acid (DEPAKENE) 250 MG/5ML syrup Take 10 mLs (500 mg total) by mouth 2 (two) times daily. 07/14/15   Van ClinesKaren M Aquino, MD   BP 112/76 mmHg  Pulse 79  Temp(Src) 98.3 F (36.8 C) (Oral)  Resp 17  SpO2 98% Physical Exam  Constitutional: She is oriented to person, place, and time. She appears well-developed and well-nourished. No distress.  HENT:  Head: Normocephalic and atraumatic.  Eyes: Conjunctivae and EOM are normal.  Neck: Neck supple. No tracheal deviation present.  Cardiovascular: Normal rate, regular rhythm and normal heart sounds.   No murmur heard. Pulmonary/Chest: Effort normal. No respiratory distress.  Abdominal: Soft. She exhibits no distension. Bowel sounds are decreased. There is no tenderness.  Bowel sounds hypoactive.   Musculoskeletal: Normal range of motion.  Neurological: She is oriented to person, place, and time.  Sleeping heavily, "normal night time behavior for her" per nephew.  Skin: Skin is warm and  dry.  Psychiatric: She has a normal mood and affect. Her behavior is normal.  Nursing note and vitals reviewed.   ED Course  Procedures (including critical care time)  DIAGNOSTIC STUDIES: Oxygen Saturation is 98% on RA, normal by my interpretation.    COORDINATION OF CARE: 11:15 PM - Discussed treatment plan with pt's nephew at bedside which includes symptomatic treatment for vomiting. If pt's symptoms improve, we will discharge her home. Pt's nephew  verbalized understanding and agreed to plan.   MDM   Final diagnoses:  None   1. Nausea with vomiting  The patient has had no vomiting here. Per nephew who provides history and acts as interpreter, she has had vomiting only, without diarrhea, fever, hematemesis or abdominal pain. Zofran provided here and the patient subsequently tolerates PO fluids without vomiting. There is a sick family member with similar symptoms. Likely viral process with controlled symptoms here. She is felt stable for discharge home.   I personally performed the services described in this documentation, which was scribed in my presence. The recorded information has been reviewed and is accurate.       Elpidio Anis, PA-C 07/21/15 0015  Elpidio Anis, PA-C 07/21/15 0017  Gilda Crease, MD 07/21/15 [phone]

## 2015-07-20 NOTE — ED Notes (Signed)
Bed: WA04 Expected date:  Expected time:  Means of arrival:  Comments: EMS 45 yo female from home, nausea and vomiting 2 hours ago-limited [place]

## 2015-07-21 MED ORDER — ONDANSETRON 8 MG PO TBDP: 8.0000 mg | ORAL_TABLET | Freq: Three times a day (TID) | ORAL | Status: DC | PRN

## 2015-07-21 NOTE — Discharge Instructions (Signed)

## 2015-08-04 ENCOUNTER — Ambulatory Visit: Payer: Medicaid Other | Attending: Family Medicine | Admitting: Family Medicine

## 2015-08-04 ENCOUNTER — Encounter: Payer: Self-pay | Admitting: Family Medicine

## 2015-08-04 VITALS — BP 110/75 | HR 78 | Temp 97.2°F | Resp 18 | Wt 110.0 lb

## 2015-08-04 DIAGNOSIS — M7989 Other specified soft tissue disorders: Secondary | ICD-10-CM | POA: Insufficient documentation

## 2015-08-04 DIAGNOSIS — Z79899 Other long term (current) drug therapy: Secondary | ICD-10-CM | POA: Diagnosis not present

## 2015-08-04 DIAGNOSIS — R609 Edema, unspecified: Secondary | ICD-10-CM

## 2015-08-04 DIAGNOSIS — L84 Corns and callosities: Secondary | ICD-10-CM | POA: Diagnosis not present

## 2015-08-04 DIAGNOSIS — M79673 Pain in unspecified foot: Secondary | ICD-10-CM | POA: Diagnosis not present

## 2015-08-04 DIAGNOSIS — R600 Localized edema: Secondary | ICD-10-CM | POA: Insufficient documentation

## 2015-08-04 DIAGNOSIS — F17210 Nicotine dependence, cigarettes, uncomplicated: Secondary | ICD-10-CM | POA: Diagnosis not present

## 2015-08-04 DIAGNOSIS — S90552A Superficial foreign body, left ankle, initial encounter: Secondary | ICD-10-CM | POA: Insufficient documentation

## 2015-08-04 MED ORDER — MEDICAL COMPRESSION STOCKINGS MISC: 1.0000 | Freq: Every day | Status: AC

## 2015-08-04 NOTE — Assessment & Plan Note (Signed)
Wound care for callus

## 2015-08-04 NOTE — Patient Instructions (Addendum)
Diagnoses and all orders for this visit:  Peripheral edema -     Elastic Bandages & Supports (MEDICAL COMPRESSION STOCKINGS) MISC; 1 each by Does not apply route daily. 30 mm Hg  Callus of foot -     Ambulatory referral to Wound Clinic   Elevate legs  Wear compression stockings to prevent leg swelling  F/u in 3 months for edema  Dr. Armen PickupFunches

## 2015-08-04 NOTE — Assessment & Plan Note (Signed)
Intermittent peripheral edema  Reassurance Compression stockings
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
## 2015-08-05 ENCOUNTER — Encounter: Payer: Self-pay | Admitting: Family Medicine

## 2015-08-24 ENCOUNTER — Encounter (HOSPITAL_BASED_OUTPATIENT_CLINIC_OR_DEPARTMENT_OTHER): Payer: Medicaid Other | Attending: Surgery

## 2015-08-24 DIAGNOSIS — I890 Lymphedema, not elsewhere classified: Secondary | ICD-10-CM | POA: Insufficient documentation

## 2015-08-24 DIAGNOSIS — F17210 Nicotine dependence, cigarettes, uncomplicated: Secondary | ICD-10-CM | POA: Diagnosis not present

## 2015-08-24 DIAGNOSIS — L910 Hypertrophic scar: Secondary | ICD-10-CM | POA: Insufficient documentation

## 2015-09-20 ENCOUNTER — Other Ambulatory Visit: Payer: Self-pay | Admitting: Neurology

## 2015-09-20 MED ORDER — LACOSAMIDE 100 MG PO TABS: 1.5000 | ORAL_TABLET | Freq: Two times a day (BID) | ORAL | Status: DC

## 2015-09-20 NOTE — Addendum Note (Signed)
Addended bySilvio Pate: Supriya Beaston L on: 09/20/2015 03:55 PM   Modules accepted: Orders

## 2015-09-20 NOTE — Telephone Encounter (Signed)
Vimpat called to pharmacy - controlled medication.

## 2015-09-20 NOTE — Telephone Encounter (Signed)
Vimpat refill requested. Per last office note- patient to remain on medication. Refill approved and sent to patient's pharmacy.   

## 2015-10-17 ENCOUNTER — Encounter: Payer: Self-pay | Admitting: Family Medicine

## 2015-10-17 ENCOUNTER — Ambulatory Visit: Payer: Medicaid Other | Attending: Family Medicine | Admitting: Family Medicine

## 2015-10-17 VITALS — BP 97/67 | HR 82 | Temp 98.5°F | Resp 18 | Ht 58.5 in | Wt 115.0 lb

## 2015-10-17 DIAGNOSIS — G40909 Epilepsy, unspecified, not intractable, without status epilepticus: Secondary | ICD-10-CM | POA: Diagnosis not present

## 2015-10-17 DIAGNOSIS — R600 Localized edema: Secondary | ICD-10-CM | POA: Insufficient documentation

## 2015-10-17 DIAGNOSIS — F17210 Nicotine dependence, cigarettes, uncomplicated: Secondary | ICD-10-CM | POA: Insufficient documentation

## 2015-10-17 DIAGNOSIS — Z79899 Other long term (current) drug therapy: Secondary | ICD-10-CM | POA: Insufficient documentation

## 2015-10-17 DIAGNOSIS — R609 Edema, unspecified: Secondary | ICD-10-CM

## 2015-10-17 MED ORDER — ONE-A-DAY WOMENS FORMULA PO TABS: 1.0000 | ORAL_TABLET | Freq: Every day | ORAL | Status: AC

## 2015-10-17 NOTE — Patient Instructions (Addendum)
Rachel Bush was seen today for edema.  Diagnoses and all orders for this visit:  Peripheral edema  Other orders -     Multiple Vitamins-Calcium (ONE-A-DAY WOMENS FORMULA) TABS; Take 1 tablet by mouth daily.  Elevate legs at rest Compression stockings  F/u in 3 months sooner if needed for seizures and leg swelling swelling   Dr. Armen PickupFunches
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
## 2015-10-17 NOTE — Progress Notes (Signed)
Patient is here for FU Edema  Patient denies pain at this time.  Patient has taken medication today. Patient only ate a little.  Patient has had a poor appetite over the past 5 days. Patient only consumes a very small amount of liquid and food.

## 2015-10-17 NOTE — Assessment & Plan Note (Signed)
Trace LE edema in both feet  Plan: Elevation Compression Continue walking exercise

## 2015-12-18 ENCOUNTER — Encounter (HOSPITAL_COMMUNITY): Payer: Self-pay | Admitting: Family Medicine

## 2015-12-18 ENCOUNTER — Ambulatory Visit (HOSPITAL_COMMUNITY)
Admission: EM | Admit: 2015-12-18 | Discharge: 2015-12-18 | Disposition: A | Discharge status: Home / Self Care | Payer: Medicaid Other | Attending: Family Medicine | Admitting: Family Medicine

## 2015-12-18 ENCOUNTER — Ambulatory Visit (INDEPENDENT_AMBULATORY_CARE_PROVIDER_SITE_OTHER): Payer: Medicaid Other

## 2015-12-18 DIAGNOSIS — M791 Myalgia: Secondary | ICD-10-CM | POA: Diagnosis not present

## 2015-12-18 DIAGNOSIS — M7918 Myalgia, other site: Secondary | ICD-10-CM

## 2015-12-18 MED ORDER — MELOXICAM 7.5 MG PO TABS: 7.5000 mg | ORAL_TABLET | Freq: Every day | ORAL | 0 refills | Status: DC

## 2015-12-18 NOTE — Discharge Instructions (Signed)
You will be sore for the next week or so. If you're not getting better after a week with the medicine prescribed, please return
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
## 2016-01-06 ENCOUNTER — Ambulatory Visit: Payer: Medicaid Other | Admitting: Neurology

## 2016-01-31 ENCOUNTER — Ambulatory Visit: Payer: Medicaid Other | Attending: Family Medicine | Admitting: Family Medicine

## 2016-01-31 ENCOUNTER — Encounter: Payer: Self-pay | Admitting: Family Medicine

## 2016-01-31 VITALS — BP 101/70 | HR 75 | Temp 97.8°F | Wt 118.6 lb

## 2016-01-31 DIAGNOSIS — R630 Anorexia: Secondary | ICD-10-CM | POA: Diagnosis not present

## 2016-01-31 DIAGNOSIS — G40409 Other generalized epilepsy and epileptic syndromes, not intractable, without status epilepticus: Secondary | ICD-10-CM | POA: Diagnosis not present

## 2016-01-31 DIAGNOSIS — F17210 Nicotine dependence, cigarettes, uncomplicated: Secondary | ICD-10-CM | POA: Diagnosis not present

## 2016-01-31 DIAGNOSIS — M7989 Other specified soft tissue disorders: Secondary | ICD-10-CM | POA: Diagnosis not present

## 2016-01-31 DIAGNOSIS — Z23 Encounter for immunization: Secondary | ICD-10-CM | POA: Diagnosis not present

## 2016-01-31 DIAGNOSIS — R609 Edema, unspecified: Secondary | ICD-10-CM

## 2016-01-31 DIAGNOSIS — R600 Localized edema: Secondary | ICD-10-CM | POA: Insufficient documentation

## 2016-01-31 DIAGNOSIS — G40309 Generalized idiopathic epilepsy and epileptic syndromes, not intractable, without status epilepticus: Secondary | ICD-10-CM

## 2016-01-31 DIAGNOSIS — K089 Disorder of teeth and supporting structures, unspecified: Secondary | ICD-10-CM

## 2016-01-31 LAB — COMPLETE METABOLIC PANEL WITH GFR
ALK PHOS: 78 U/L (ref 33–115)
ALT: 8 U/L (ref 6–29)
AST: 19 U/L (ref 10–30)
Albumin: 3.5 g/dL — ABNORMAL LOW (ref 3.6–5.1)
BILIRUBIN TOTAL: 0.4 mg/dL (ref 0.2–1.2)
BUN: 7 mg/dL (ref 7–25)
CALCIUM: 9.2 mg/dL (ref 8.6–10.2)
CO2: 23 mmol/L (ref 20–31)
CREATININE: 0.61 mg/dL (ref 0.50–1.10)
Chloride: 100 mmol/L (ref 98–110)
GFR, Est Non African American: >89 mL/min (ref >=60)
Glucose, Bld: 84 mg/dL (ref 65–99)
Potassium: 4.5 mmol/L (ref 3.5–5.3)
Sodium: 133 mmol/L — ABNORMAL LOW (ref 135–146)
TOTAL PROTEIN: 6.9 g/dL (ref 6.1–8.1)

## 2016-01-31 LAB — CBC
HCT: 39.8 % (ref 35.0–45.0)
Hemoglobin: 13.5 g/dL (ref 11.7–15.5)
MCH: 29.7 pg (ref 27.0–33.0)
MCHC: 33.9 g/dL (ref 32.0–36.0)
MCV: 87.7 fL (ref 80.0–100.0)
MPV: 11.8 fL (ref 7.5–12.5)
PLATELETS: 247 10*3/uL (ref 140–400)
RBC: 4.54 MIL/uL (ref 3.80–5.10)
RDW: 13.8 % (ref 11.0–15.0)
WBC: 10.3 10*3/uL (ref 3.8–10.8)

## 2016-01-31 MED ORDER — LACOSAMIDE 100 MG PO TABS: 1.5000 | ORAL_TABLET | Freq: Two times a day (BID) | ORAL | 5 refills | Status: DC

## 2016-01-31 NOTE — Assessment & Plan Note (Signed)
A: controlled seizure free P: Continue current medications at current dose Will decrease vimpat back to 100 mg BID if appetite does not improve

## 2016-01-31 NOTE — Assessment & Plan Note (Signed)
A: persistent edema, compliant with compression P: Continue compression Check CBC and CMP

## 2016-01-31 NOTE — Progress Notes (Signed)
Pt hasn't had an appetite in 5-6 months Pt wants flu shot today Pt says she will only eat if she had been seen today

## 2016-01-31 NOTE — Progress Notes (Signed)
Subjective:  Patient ID: Rachel Bush, female    DOB: 01/03/1971  Age: 45 y.o. 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 Leg swelling: improved. No just a little swelling. She wears compression stockings. She walks 15-20 mins a day.  She does not complain of pain.   2. Decreased appetite: x 5-6 months. No reports of dental, throat or abdominal pain. No emesis.  No indication of pain with swallowing. Her anorexia has coincided with her vimpat dose increase to 150 mg BID.   3. Epilepsy: she is followed by neurology. She compliant with vimpat, depakene and keppra. She has been seizure free for the past 6 months which coincides with her increase in vimpat dose.    Social History  Substance Use Topics  . Smoking status: Current Every Day Smoker    Types: Cigarettes  . Smokeless tobacco: Never Used     Comment: 1-2 cigarettes per day.  . Alcohol use No    Outpatient Medications Prior to Visit  Medication Sig Dispense Refill  . Elastic Bandages & Supports (MEDICAL COMPRESSION STOCKINGS) MISC 1 each by Does not apply route daily. 30 mm Hg 2 each 0  . Lacosamide (VIMPAT) 100 MG TABS Take 1.5 tablets (150 mg total) by mouth 2 (two) times daily. 90 tablet 5  . levETIRAcetam (KEPPRA) 100 MG/ML solution TAKE 15MLS BY MOUTH TWICE DAILY (Patient taking differently: Take 1,500 mg by mouth 2 (two) times daily. ) 900 mL 11  . meloxicam (MOBIC) 7.5 MG tablet Take 1 tablet (7.5 mg total) by mouth daily. 14 tablet 0  . Multiple Vitamins-Calcium (ONE-A-DAY WOMENS FORMULA) TABS Take 1 tablet by mouth daily.  0  . valproic acid (DEPAKENE) 250 MG/5ML syrup Take 10 mLs (500 mg total) by mouth 2 (two) times daily. 600 mL 11   No facility-administered medications prior to visit.     ROS Review of Systems  Unable to perform ROS: Patient  nonverbal    Objective:  BP 101/70 (BP Location: Right Arm, Patient Position: Sitting, Cuff Size: Small)   Pulse 75   Temp 97.8 F (36.6 C) (Oral)   Wt 118 lb 9.6 oz (53.8 kg)   SpO2 92%   BMI 24.37 kg/m   BP/Weight 01/31/2016 12/18/2015 10/17/2015  Systolic BP 101 124 97  Diastolic BP 70 68 67  Wt. (Lbs) 118.6 - 115  BMI 24.37 - 23.62   Wt Readings from Last 3 Encounters:  10/17/15 115 lb (52.2 kg)  08/04/15 110 lb (49.9 kg)  07/14/15 109 lb (49.4 kg)    Physical Exam  Musculoskeletal: She exhibits edema.       Feet:  Skin:        Assessment & Plan:  Rachel Bush was seen today for leg swelling and anorexia.  Diagnoses and all orders for this visit:  Decreased appetite -     COMPLETE METABOLIC PANEL WITH GFR -     CBC  Encounter for immunization -     Flu Vaccine QUAD 36+ mos IM -     Lacosamide (VIMPAT) 100 MG TABS; Take 1.5 tablets (150 mg total) by mouth 2 (two) times daily. -     Ambulatory referral to Dentistry -     COMPLETE METABOLIC PANEL WITH GFR -     CBC  Poor dentition -  Ambulatory referral to Dentistry  Generalized seizure disorder (HCC) -     Lacosamide (VIMPAT) 100 MG TABS; Take 1.5 tablets (150 mg total) by mouth 2 (two) times daily.  Peripheral edema -     COMPLETE METABOLIC PANEL WITH GFR -     CBC  Needs flu shot -     Flu Vaccine QUAD 36+ mos IM   There are no diagnoses linked to this encounter. There are no diagnoses linked to this encounter. No orders of the defined types were placed in this encounter.   Follow-up: Return in about 4 weeks (around 02/28/2016) for weight check .   Dessa Phi MD

## 2016-01-31 NOTE — Assessment & Plan Note (Signed)
Very poor dentition Dental referral placed

## 2016-01-31 NOTE — Patient Instructions (Addendum)
Rachel Bush was seen today for leg swelling and anorexia.  Diagnoses and all orders for this visit:  Decreased appetite -     COMPLETE METABOLIC PANEL WITH GFR -     CBC  Encounter for immunization -     Flu Vaccine QUAD 36+ mos IM -     Lacosamide (VIMPAT) 100 MG TABS; Take 1.5 tablets (150 mg total) by mouth 2 (two) times daily.  Poor dentition -     Ambulatory referral to Dentistry  Take ensure supplement twice daily   F/u in 4 weeks for weight check   Dr. Armen PickupFunches

## 2016-01-31 NOTE — Assessment & Plan Note (Signed)
A: decreased appetite without weight loss x 6 months Suspect increase dose of vimpat causing change in appetite P: Ensure BID supplement Dental referral for poor dentition Plan to decrease vimpat back to 100 mg BID if appetite dose not improve

## 2016-01-31 NOTE — Progress Notes (Signed)
Pt has not had an appetite for 5 or 6 months.  Pt needs refill on vimpat.  Pt is getting flu shot today.

## 2016-02-08 ENCOUNTER — Telehealth: Payer: Self-pay

## 2016-02-08 NOTE — Telephone Encounter (Signed)
Pt was called on 11/1 and informed of lab results.

## 2016-03-20 ENCOUNTER — Telehealth: Payer: Self-pay | Admitting: Family Medicine

## 2016-03-20 DIAGNOSIS — K069 Disorder of gingiva and edentulous alveolar ridge, unspecified: Secondary | ICD-10-CM

## 2016-03-20 NOTE — Telephone Encounter (Signed)
[facility]Ann Armstrong[facility] Fax # [phone]  Patient had advanced peridontal disease Needs referral to oral surgeon Needs deep cleaning Likely need tooth extraction  I have placed a referral to oral surgeon

## 2016-03-21 NOTE — Telephone Encounter (Signed)
Patient has an appointment  04-16-16 @ 1:30pm Dr Ocie DoyneScott Jensen . I spoke to her Sister husband and he is aware of [facility]  Appointment and to bring rx ,[facility] .

## 2016-03-21 NOTE — Telephone Encounter (Signed)
Awesome! Thank you Rachel Bush  Alycia, Please call to Dr. Phillip HealAnn Armstrongs office (phone [phone]) and relay message that patient has been referred to Dr.  Hewitt ShortsScott Jensen's office, appt 04-16-2016

## 2016-03-22 NOTE — Telephone Encounter (Signed)
Placed call to old Dr. Isidore Moosffice to inform them of pt new Dr. Isidore Moosffice and appointment time.

## 2016-04-20 ENCOUNTER — Ambulatory Visit (INDEPENDENT_AMBULATORY_CARE_PROVIDER_SITE_OTHER): Payer: Medicaid Other | Admitting: Neurology

## 2016-04-20 ENCOUNTER — Encounter: Payer: Self-pay | Admitting: Neurology

## 2016-04-20 VITALS — BP 112/68 | HR 86 | Ht 58.5 in | Wt 121.5 lb

## 2016-04-20 DIAGNOSIS — G40309 Generalized idiopathic epilepsy and epileptic syndromes, not intractable, without status epilepticus: Secondary | ICD-10-CM

## 2016-04-20 DIAGNOSIS — G40319 Generalized idiopathic epilepsy and epileptic syndromes, intractable, without status epilepticus: Secondary | ICD-10-CM | POA: Diagnosis not present

## 2016-04-20 MED ORDER — LACOSAMIDE 100 MG PO TABS: 1.5000 | ORAL_TABLET | Freq: Two times a day (BID) | ORAL | 5 refills | Status: DC

## 2016-04-20 MED ORDER — LEVETIRACETAM 100 MG/ML PO SOLN: ORAL | 11 refills | Status: DC

## 2016-04-20 MED ORDER — VALPROATE SODIUM 250 MG/5ML PO SOLN: ORAL | 11 refills | Status: DC
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
## 2016-04-20 NOTE — Progress Notes (Signed)
NEUROLOGY FOLLOW UP OFFICE NOTE  Lucia GaskinsDamanta Rolfe [id number]  HISTORY OF PRESENT ILLNESS: I had the pleasure of seeing Rachel Bush in follow-up in the neurology clinic on 04/20/2016.  The patient was last seen 9 months ago for symptomatic generalized epilepsy. She is again accompanied by her sister who helps supplement the history today. A [place] medical translator helps with translation. On her last visit, they reported 2 seizures in a 3 month period. Vimpat dose was increased to 150mg  BID. She is also taking Keppra 1500mg  BID and Depakote 500mg  BID with no side effects. Since her last visit, they report only 1 nocturnal seizure last 04/06/16 lasting 5 minutes. She returns to baseline more quickly. Family denies any staring episodes. They are happy with current seizure control, she is eating a lot and more interactive with family.   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 Her sister provides the history due to patient's cognitive status. She reports that seizures started around a week after birth. As far as her sister knows, she had a normal delivery. There is no family history of seizures. She describes seizures starting with staring and unresponsiveness, her eyes look like they are moving around, then both hands curl up and she becomes stiff with shaking. She would "vocalize like a goat." She would be very tired after a convulsion. She had been living in [place] where she apparently was having more seizures and side effects to unrecalled medications, but since moving here and adjusting medications, seizure control has been better. In the past, she would have seizures 2-3 times a week, longest seizure-free interval has been 2 months.   Records on EPIC were reviewed. She was admitted to [facility] last March 2016 in status epilepticus, per ER notes family reported she was  having seizures all night and had 3 seizures witnessed by EMS. She was reported to be taking Depakote and Keppra, however Depakote level was <10. Keppra level was 71 (ref 10-40). She was continued on Keppra and Depakote, with Depakote load given, and started on Vimpat. I personally reviewed head CT which did not show any acute changes. There was cerebellar atrophy seen and small calcifications in the left frontal region. EEG showed diffuse slowing, with frequent sharp waves over the right hemisphere with phase reversals primarily in the frontal and parietal regions. Occasional generalized sharp wave discharges were seen. She had a clinical seizure with EEG showing initially an increase in focal as well as generalized sharp wave discharges followed by fairly abrupt onset of diffuse rhythmic 12 Hz activity which rapidly became almost obscured by motor activity. The rhythmic fast activity rapidly gave way to rhythmic diffuse slow-wave activity for several seconds, then return to baseline. She continued to improve, however was noted to have poor appetite and nausea, Vimpat was held and Keppra and Depakote maximized. She was discharged home on Keppra 1500mg  BID and Depakote 500mg  BID. She was seen by her PCP in June, with report of 10 GTCs in 6 weeks. Depakote increased to 750mg  BID, however Depakote level was 134.2 and dose reduced back to 500mg  BID. Repeat trough levels in August showed a Depakote level of 114, Keppra level 39.2. [facility] which reported Dysrhthmia grade 3 generalized, "slow wave seizures," with disorganized background activity of 5-7 Hz. As the record progresses, intermittent episodes of delta frequency slowing in a generalized  fashion are seen intermixed with sharp and slow wave complexes that emanate primarily from the right temporal region. Between episodes of delta slowing, a lower amplitude theta frequency baseline slowing is seen. There was concern that the episodes of generalized  higher amplitude delta intermixed with sharp and slow wave complexes may represent "slow wave seizures" suggesting subclinical seizures. Vimpat was added back, and increased to 100mg  BID last October. Her family denies any GTCs since 01/27/15. She is tolerating addition of Vimpat better this time. Family also reports staring episodes occurring 1-2 times a week, last episode was 3 days ago.   Her sister reports that at baseline, she can speak but in "slow motion" that only her sister can understand. She can sometimes answer her sister immediately, other times there is delay. She is dependent on all ADLs, including eating. She can walk short distances but needs her sister to assist her.   Epilepsy Risk Factors: Severe cognitive impairment with left-sided hemiparesis. Per sister, she had a normal delivery but had developmental delay. As far as her sister knows, there is no history of febrile convulsions, CNS infections such as meningitis/encephalitis, significant traumatic brain injury, neurosurgical procedures, or family history of seizures.  Prior AEDs: Phenobarbital  Diagnostic Data: Her 1-hour EEG was abnormal due to mild diffuse slowing, and occasional epileptiform discharges seen independently over the bilateral frontal regions, left greater than right. Previous EEG reported "slow wave seizures" suggesting subclinical seizures, primarily from the right temporal region.   PAST MEDICAL HISTORY: Past Medical History:  Diagnosis Date  . Seizures (HCC)    since baby    MEDICATIONS: Current Outpatient Prescriptions on File Prior to Visit  Medication Sig Dispense Refill  . Elastic Bandages & Supports (MEDICAL COMPRESSION STOCKINGS) MISC 1 each by Does not apply route daily. 30 mm Hg 2 each 0  . Lacosamide (VIMPAT) 100 MG TABS Take 1.5 tablets (150 mg total) by mouth 2 (two) times daily. 90 tablet 5  . levETIRAcetam (KEPPRA) 100 MG/ML solution TAKE BY MOUTH TWICE DAILY (Patient taking  differently: Take 1,500 mg by mouth 2 (two) times daily. ) 900 mL 11  . meloxicam (MOBIC) 7.5 MG tablet Take 1 tablet (7.5 mg total) by mouth daily. 14 tablet 0  . Multiple Vitamins-Calcium (ONE-A-DAY WOMENS FORMULA) TABS Take 1 tablet by mouth daily.  0  . valproic acid (DEPAKENE) 250 MG/5ML syrup Take 10 mLs (500 mg total) by mouth 2 (two) times daily. 600 mL 11   No current facility-administered medications on file prior to visit.     ALLERGIES: No Known Allergies  FAMILY HISTORY: Family History  Problem Relation Age of Onset  . Diabetes Mother   . Hypertension Father     SOCIAL HISTORY: Social History   Social History  . Marital status: Single    Spouse name: N/A  . Number of children: 0  . Years of education: None   Occupational History  . Disabled    Social History Main Topics  . Smoking status: Current Every Day Smoker    Types: Cigarettes  . Smokeless tobacco: Never Used     Comment: 1-2 cigarettes per day.  . Alcohol use No  . Drug use: No  . Sexual activity: No     Comment: Patient has never been sexually active and has no children.  Has never had pap smear, did not tolerate attempted pap smear in office and would likely need anesthesia if pap smear required in future.    Other Topics  Concern  . Not on file   Social History Narrative   Lives at home with mother and sister.   Right-handed.   One cup caffeine daily.      Per sister:   Patient has never been sexually active and has no children.  Has never had pap smear, did not tolerate attempted pap smear in office and would likely need anesthesia if pap smear required in future (for abnormal bleeding, etc).    REVIEW OF SYSTEMS unable to obtain due to cognitive impairment, minimal verbal output  PHYSICAL EXAM: Vitals:   04/20/16 1526  BP: 112/68  Pulse: 86   General: No acute distress, smiles and shakes my hand, tries to follow simple commands, minimal verbal output, tries to count fingers Head:  Normocephalic/atraumatic Eyes: unable to perform fundoscopy Neck: supple, full range of motion Back: No paraspinal tenderness Heart: regular rate and rhythm Lungs: Clear to auscultation bilaterally. Vascular: No carotid bruits. Skin/Extremities: No rash, no edema Neurological Exam: Mental status: awake and alert, as above  Cranial nerves: CN I: not tested CN II: pupils equal, round and reactive to light, +blink to threat bilaterally CN III, IV, VI: full range of motion, no nystagmus, no ptosis CN VII: upper and lower face symmetric CN XI: sternocleidomastoid and trapezius muscles intact CN XII: tongue midline Bulk & Tone: normal, no fasciculations. Motor: unable to do formal motor testing, but appears to have mild left-sided weakness, more notable on ambulation on left leg (similar to prior) Sensation: intact to light touch Deep Tendon Reflexes: +2 throughout Plantar responses: downgoing bilaterally Cerebellar: no incoordination on reaching for objects Gait: slow and cautious, needs assistance, appears to favor left leg (similar to prior) Tremor: none  IMPRESSION: This is a 46 yo woman with severe cognitive impairment, mild left hemiparesis, and seizures since infancy suggestive of symptomatic generalized epilepsy. Her sister describes staring spells and GTCs, EEGs have shown diffuse slowing, frequent right hemisphere epileptiform discharges, as well as generalized discharges. A previous EEG was concerning for subclinical seizures, her recent 1-hour EEG did not show any electrographic seizures, there were independent epileptiform discharges over the frontal regions, left greater than right. Her sister denies any staring episodes since her last visit. She has had 1 convulsion in the past 9 months. Family is happy with seizure control. Continue Vimpat to 150mg  BID, liquid Keppra 1500mg  BID and Depakote 500mg  BID. She will follow-up in 3 months, her sister knows to call for any changes.    Thank you for allowing me to participate in her care.  Please do not hesitate to call for any questions or concerns.  The duration of this appointment visit was 25 minutes of face-to-face time with the patient.  Greater than 50% of this time was spent in counseling, explanation of diagnosis, planning of further management, and coordination of care.   Patrcia Dolly, M.D.   CC: Dr. Armen Pickup

## 2016-04-29 ENCOUNTER — Encounter: Payer: Self-pay | Admitting: Neurology

## 2016-07-20 ENCOUNTER — Encounter: Payer: Self-pay | Admitting: Neurology

## 2016-07-20 ENCOUNTER — Ambulatory Visit (INDEPENDENT_AMBULATORY_CARE_PROVIDER_SITE_OTHER): Payer: Medicaid Other | Admitting: Neurology

## 2016-07-20 VITALS — BP 110/66 | HR 82 | Temp 98.2°F | Resp 16 | Ht <= 58 in | Wt 120.5 lb

## 2016-07-20 DIAGNOSIS — G40319 Generalized idiopathic epilepsy and epileptic syndromes, intractable, without status epilepticus: Secondary | ICD-10-CM

## 2016-07-20 NOTE — Progress Notes (Signed)
NEUROLOGY FOLLOW UP OFFICE NOTE  Rachel Bush [id number]  HISTORY OF PRESENT ILLNESS: I had the pleasure of seeing Rachel Bush in follow-up in the neurology clinic on 07/20/2016.  The patient was last seen 3 months ago for symptomatic generalized epilepsy. She is accompanied by 2 family members who help supplement the history today. A [place] medical translator helps with translation. She continues to do well with no seizures since 04/06/16. She is tolerating Vimpat  BID, Keppra  BID, and Depakote  BID with no side effects. Family denies any staring episodes. They are happy with current seizure control. Appetite is good, they deny any falls.   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 Her sister provides the history due to patient's cognitive status. She reports that seizures started around a week after birth. As far as her sister knows, she had a normal delivery. There is no family history of seizures. She describes seizures starting with staring and unresponsiveness, her eyes look like they are moving around, then both hands curl up and she becomes stiff with shaking. She would "vocalize like a goat." She would be very tired after a convulsion. She had been living in [place] where she apparently was having more seizures and side effects to unrecalled medications, but since moving here and adjusting medications, seizure control has been better. In the past, she would have seizures 2-3 times a week, longest seizure-free interval has been 2 months.   Records on EPIC were reviewed. She was admitted to [facility] last March 2016 in status epilepticus, per ER notes family reported she was having seizures all night and had 3 seizures witnessed by EMS. She was reported to be taking Depakote and Keppra, however Depakote level was <10. Keppra level was 71 (ref  10-40). She was continued on Keppra and Depakote, with Depakote load given, and started on Vimpat. I personally reviewed head CT which did not show any acute changes. There was cerebellar atrophy seen and small calcifications in the left frontal region. EEG showed diffuse slowing, with frequent sharp waves over the right hemisphere with phase reversals primarily in the frontal and parietal regions. Occasional generalized sharp wave discharges were seen. She had a clinical seizure with EEG showing initially an increase in focal as well as generalized sharp wave discharges followed by fairly abrupt onset of diffuse rhythmic 12 Hz activity which rapidly became almost obscured by motor activity. The rhythmic fast activity rapidly gave way to rhythmic diffuse slow-wave activity for several seconds, then return to baseline. She continued to improve, however was noted to have poor appetite and nausea, Vimpat was held and Keppra and Depakote maximized. She was discharged home on Keppra  BID and Depakote  BID. She was seen by her PCP in June, with report of 10 GTCs in 6 weeks. Depakote increased to  BID, however Depakote level was 134.2 and dose reduced back to  BID. Repeat trough levels in August showed a Depakote level of 114, Keppra level 39.2. She had an EEG at [facility] which reported Dysrhthmia grade 3 generalized, "slow wave seizures," with disorganized background activity of 5-7 Hz. As the record progresses, intermittent episodes of delta frequency slowing in a generalized fashion are seen intermixed with sharp and slow wave complexes that emanate primarily from the right temporal region. Between episodes of delta slowing, a lower amplitude theta frequency baseline slowing is seen.  There was concern that the episodes of generalized higher amplitude delta intermixed with sharp and slow wave complexes may represent "slow wave seizures" suggesting subclinical seizures. Vimpat was added back, and increased  to  BID last October. Her family denies any GTCs since 01/27/15. She is tolerating addition of Vimpat better this time. Family also reports staring episodes occurring 1-2 times a week, last episode was 3 days ago.   Her sister reports that at baseline, she can speak but in "slow motion" that only her sister can understand. She can sometimes answer her sister immediately, other times there is delay. She is dependent on all ADLs, including eating. She can walk short distances but needs her sister to assist her.   Epilepsy Risk Factors: Severe cognitive impairment with left-sided hemiparesis. Per sister, she had a normal delivery but had developmental delay. As far as her sister knows, there is no history of febrile convulsions, CNS infections such as meningitis/encephalitis, significant traumatic brain injury, neurosurgical procedures, or family history of seizures.  Prior AEDs: Phenobarbital  Diagnostic Data: Her 1-hour EEG was abnormal due to mild diffuse slowing, and occasional epileptiform discharges seen independently over the bilateral frontal regions, left greater than right. Previous EEG reported "slow wave seizures" suggesting subclinical seizures, primarily from the right temporal region.   PAST MEDICAL HISTORY: Past Medical History:  Diagnosis Date  . Seizures (HCC)    since baby    MEDICATIONS: Current Outpatient Prescriptions on File Prior to Visit  Medication Sig Dispense Refill  . Elastic Bandages & Supports (MEDICAL COMPRESSION STOCKINGS) MISC 1 each by Does not apply route daily. 30 mm Hg 2 each 0  . Lacosamide (VIMPAT) 100 MG TABS Take 1.5 tablets (150 mg total) by mouth 2 (two) times daily. 90 tablet 5  . levETIRAcetam (KEPPRA) 100 MG/ML solution TAKE BY MOUTH TWICE DAILY 900 mL 11  . meloxicam (MOBIC) 7.5 MG tablet Take 1 tablet (7.5 mg total) by mouth daily. 14 tablet 0  . Multiple Vitamins-Calcium (ONE-A-DAY WOMENS FORMULA) TABS Take 1 tablet by mouth daily.  0   . Valproate Sodium (DEPAKENE) 250 MG/5ML SOLN solution Take 10mL (  total) by mouth twice a day 600 mL 11   No current facility-administered medications on file prior to visit.     ALLERGIES: No Known Allergies  FAMILY HISTORY: Family History  Problem Relation Age of Onset  . Diabetes Mother   . Hypertension Father     SOCIAL HISTORY: Social History   Social History  . Marital status: Single    Spouse name: N/A  . Number of children: 0  . Years of education: None   Occupational History  . Disabled    Social History Main Topics  . Smoking status: Current Every Day Smoker    Types: Cigarettes  . Smokeless tobacco: Never Used     Comment: 1-2 cigarettes per day.  . Alcohol use No  . Drug use: No  . Sexual activity: No     Comment: Patient has never been sexually active and has no children.  Has never had pap smear, did not tolerate attempted pap smear in office and would likely need anesthesia if pap smear required in future.    Other Topics Concern  . Not on file   Social History Narrative   Lives at home with mother and sister.   Right-handed.   One cup caffeine daily.      Per sister:   Patient has never been sexually active and has no  children.  Has never had pap smear, did not tolerate attempted pap smear in office and would likely need anesthesia if pap smear required in future (for abnormal bleeding, etc).    REVIEW OF SYSTEMS unable to obtain due to cognitive impairment, minimal verbal output  PHYSICAL EXAM: Vitals:   07/20/16 1426  BP: 110/66  Pulse: 82  Resp: 16  Temp: 98.2 F (36.8 C)   General: No acute distress, smiles and shakes my hand, tries to follow simple commands, minimal verbal output (similar to prior) Head: Normocephalic/atraumatic Eyes: unable to perform fundoscopy Neck: supple, full range of motion Back: No paraspinal tenderness Heart: regular rate and rhythm Lungs: Clear to auscultation bilaterally. Vascular: No carotid  bruits. Skin/Extremities: No rash, no edema Neurological Exam: Mental status: awake and alert, as above  Cranial nerves: CN I: not tested CN II: pupils equal, round and reactive to light, +blink to threat bilaterally CN III, IV, VI: full range of motion, no nystagmus, no ptosis CN VII: upper and lower face symmetric CN XI: sternocleidomastoid and trapezius muscles intact CN XII: tongue midline Bulk & Tone: normal, no fasciculations. Motor: unable to do formal motor testing, but appears to have mild left-sided weakness, more notable on ambulation on left leg (similar to prior) Sensation: intact to light touch Deep Tendon Reflexes: +2 throughout Plantar responses: downgoing bilaterally Cerebellar: no incoordination on reaching for objects Gait: slow and cautious, needs assistance, appears to favor left leg (similar to prior) Tremor: none  IMPRESSION: This is a 46 yo woman with severe cognitive impairment, mild left hemiparesis, and seizures since infancy suggestive of symptomatic generalized epilepsy. Her sister describes staring spells and GTCs, EEGs have shown diffuse slowing, frequent right hemisphere epileptiform discharges, as well as generalized discharges. A previous EEG was concerning for subclinical seizures, her recent 1-hour EEG did not show any electrographic seizures, there were independent epileptiform discharges over the frontal regions, left greater than right. Family denies any staring episodes, no seizures since 03/2016. Continue Vimpat to  BID, liquid Keppra  BID and Depakote  BID. She will follow-up in 6 months, family knows to call for any changes.   Thank you for allowing me to participate in her care.  Please do not hesitate to call for any questions or concerns.  The duration of this appointment visit was 15 minutes of face-to-face time with the patient.  Greater than 50% of this time was spent in counseling, explanation of diagnosis, planning of  further management, and coordination of care.   Patrcia Dolly, M.D.   CC: Dr. Armen Pickup

## 2016-07-20 NOTE — Patient Instructions (Signed)
1. Continue all your medications 2. Follow-up in 6 months, call for any changes 

## 2016-08-22 ENCOUNTER — Encounter: Payer: Self-pay | Admitting: Family Medicine

## 2017-01-21 ENCOUNTER — Ambulatory Visit (INDEPENDENT_AMBULATORY_CARE_PROVIDER_SITE_OTHER): Payer: Medicaid Other | Admitting: Neurology

## 2017-01-21 ENCOUNTER — Encounter: Payer: Self-pay | Admitting: Neurology

## 2017-01-21 VITALS — BP 112/82 | HR 83 | Ht <= 58 in | Wt 122.0 lb

## 2017-01-21 DIAGNOSIS — G40309 Generalized idiopathic epilepsy and epileptic syndromes, not intractable, without status epilepticus: Secondary | ICD-10-CM | POA: Diagnosis not present

## 2017-01-21 DIAGNOSIS — G40319 Generalized idiopathic epilepsy and epileptic syndromes, intractable, without status epilepticus: Secondary | ICD-10-CM

## 2017-01-21 DIAGNOSIS — G40219 Localization-related (focal) (partial) symptomatic epilepsy and epileptic syndromes with complex partial seizures, intractable, without status epilepticus: Secondary | ICD-10-CM | POA: Diagnosis not present

## 2017-01-21 MED ORDER — LACOSAMIDE 100 MG PO TABS: 1.5000 | ORAL_TABLET | Freq: Two times a day (BID) | ORAL | 5 refills | Status: DC

## 2017-01-21 MED ORDER — LEVETIRACETAM 100 MG/ML PO SOLN: ORAL | 11 refills | Status: DC

## 2017-01-21 MED ORDER — VALPROATE SODIUM 250 MG/5ML PO SOLN: ORAL | 11 refills | Status: DC

## 2017-01-21 NOTE — Patient Instructions (Signed)
1. Continue all medications 2. Follow-up in 6 months, call for any changes  Seizure Precautions: 1. If medication has been prescribed for you to prevent seizures, take it exactly as directed.  Do not stop taking the medicine without talking to your doctor first, even if you have not had a seizure in a long time.   2. Avoid activities in which a seizure would cause danger to yourself or to others.  Don't operate dangerous machinery, swim alone, or climb in high or dangerous places, such as on ladders, roofs, or girders.  Do not drive unless your doctor says you may.  3. If you have any warning that you may have a seizure, lay down in a safe place where you can't hurt yourself.    4.  No driving for 6 months from last seizure, as per [facility].   Please refer to the following link on the [employer]ile[employer]'s website for more information: http://www.epilepsyfoundation.org/answerplace/Social/driving/drivingu.cfm   5.  Maintain good sleep hygiene. Avoid alcohol.  6.  Contact your doctor if you have any problems that may be related to the medicine you are taking.  7.  Call 911 and bring the patient back to the ED if:        A.  The seizure lasts longer than 5 minutes.       B.  The patient doesn't awaken shortly after the seizure  C.  The patient has new problems such as difficulty seeing, speaking or moving  D.  The patient was injured during the seizure  E.  The patient has a temperature over 102 F (39C)  F.  The patient vomited and now is having trouble breathing

## 2017-01-21 NOTE — Progress Notes (Signed)
NEUROLOGY FOLLOW UP OFFICE NOTE  Rachel Bush [id number]  HISTORY OF PRESENT ILLNESS: I had the pleasure of seeing Rachel Bush in follow-up in the neurology clinic on 01/21/2017.  The patient was last seen 6 months ago for symptomatic generalized epilepsy. She is accompanied by her nephew who helps supplement the history today. A [place] medical translator helps with translation. She continues to do well with no seizures since 04/06/16. She is tolerating Vimpat  BID, Keppra  BID, and Depakote  BID with no side effects. Her nephew denies any staring or shaking episodes. She needs help with dressing and bathing.  Appetite is good, they deny any falls.   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 Her sister provides the history due to patient's cognitive status. She reports that seizures started around a week after birth. As far as her sister knows, she had a normal delivery. There is no family history of seizures. She describes seizures starting with staring and unresponsiveness, her eyes look like they are moving around, then both hands curl up and she becomes stiff with shaking. She would "vocalize like a goat." She would be very tired after a convulsion. She had been living in [place] where she apparently was having more seizures and side effects to unrecalled medications, but since moving here and adjusting medications, seizure control has been better. In the past, she would have seizures 2-3 times a week, longest seizure-free interval has been 2 months.   Records on EPIC were reviewed. She was admitted to [facility] last March 2016 in status epilepticus, per ER notes family reported she was having seizures all night and had 3 seizures witnessed by EMS. She was reported to be taking Depakote and Keppra, however Depakote level was <10. Keppra level was 71  (ref 10-40). She was continued on Keppra and Depakote, with Depakote load given, and started on Vimpat. I personally reviewed head CT which did not show any acute changes. There was cerebellar atrophy seen and small calcifications in the left frontal region. EEG showed diffuse slowing, with frequent sharp waves over the right hemisphere with phase reversals primarily in the frontal and parietal regions. Occasional generalized sharp wave discharges were seen. She had a clinical seizure with EEG showing initially an increase in focal as well as generalized sharp wave discharges followed by fairly abrupt onset of diffuse rhythmic 12 Hz activity which rapidly became almost obscured by motor activity. The rhythmic fast activity rapidly gave way to rhythmic diffuse slow-wave activity for several seconds, then return to baseline. She continued to improve, however was noted to have poor appetite and nausea, Vimpat was held and Keppra and Depakote maximized. She was discharged home on Keppra  BID and Depakote  BID. She was seen by her PCP in June, with report of 10 GTCs in 6 weeks. Depakote increased to  BID, however Depakote level was 134.2 and dose reduced back to  BID. Repeat trough levels in August showed a Depakote level of 114, Keppra level 39.2. She had an EEG at [facility] which reported Dysrhthmia grade 3 generalized, "slow wave seizures," with disorganized background activity of 5-7 Hz. As the record progresses, intermittent episodes of delta frequency slowing in a generalized fashion are seen intermixed with sharp and slow wave complexes that emanate primarily from the right temporal region. Between episodes of delta slowing, a lower amplitude theta frequency baseline  slowing is seen. There was concern that the episodes of generalized higher amplitude delta intermixed with sharp and slow wave complexes may represent "slow wave seizures" suggesting subclinical seizures. Vimpat was added back, and  increased to  BID last October. Her family denies any GTCs since 01/27/15. She is tolerating addition of Vimpat better this time. Family also reports staring episodes occurring 1-2 times a week, last episode was 3 days ago.   Her sister reports that at baseline, she can speak but in "slow motion" that only her sister can understand. She can sometimes answer her sister immediately, other times there is delay. She is dependent on all ADLs, including eating. She can walk short distances but needs her sister to assist her.   Epilepsy Risk Factors: Severe cognitive impairment with left-sided hemiparesis. Per sister, she had a normal delivery but had developmental delay. As far as her sister knows, there is no history of febrile convulsions, CNS infections such as meningitis/encephalitis, significant traumatic brain injury, neurosurgical procedures, or family history of seizures.  Prior AEDs: Phenobarbital  Diagnostic Data: Her 1-hour EEG was abnormal due to mild diffuse slowing, and occasional epileptiform discharges seen independently over the bilateral frontal regions, left greater than right. Previous EEG reported "slow wave seizures" suggesting subclinical seizures, primarily from the right temporal region.   PAST MEDICAL HISTORY: Past Medical History:  Diagnosis Date  . Seizures (HCC)    since baby    MEDICATIONS: Current Outpatient Prescriptions on File Prior to Visit  Medication Sig Dispense Refill  . Elastic Bandages & Supports (MEDICAL COMPRESSION STOCKINGS) MISC 1 each by Does not apply route daily. 30 mm Hg 2 each 0  . Lacosamide (VIMPAT) 100 MG TABS Take 1.5 tablets (150 mg total) by mouth 2 (two) times daily. 90 tablet 5  . levETIRAcetam (KEPPRA) 100 MG/ML solution TAKE BY MOUTH TWICE DAILY 900 mL 11  . Multiple Vitamins-Calcium (ONE-A-DAY WOMENS FORMULA) TABS Take 1 tablet by mouth daily.  0  . Valproate Sodium (DEPAKENE) 250 MG/5ML SOLN solution Take 10mL (  total)  by mouth twice a day 600 mL 11   No current facility-administered medications on file prior to visit.     ALLERGIES: No Known Allergies  FAMILY HISTORY: Family History  Problem Relation Age of Onset  . Diabetes Mother   . Hypertension Father     SOCIAL HISTORY: Social History   Social History  . Marital status: Single    Spouse name: N/A  . Number of children: 0  . Years of education: None   Occupational History  . Disabled    Social History Main Topics  . Smoking status: Current Every Day Smoker    Types: Cigarettes  . Smokeless tobacco: Never Used     Comment: 1-2 cigarettes per day.  . Alcohol use No  . Drug use: No  . Sexual activity: No     Comment: Patient has never been sexually active and has no children.  Has never had pap smear, did not tolerate attempted pap smear in office and would likely need anesthesia if pap smear required in future.    Other Topics Concern  . Not on file   Social History Narrative   Lives at home with mother and sister.   Right-handed.   One cup caffeine daily.      Per sister:   Patient has never been sexually active and has no children.  Has never had pap smear, did not tolerate attempted pap smear in office and  would likely need anesthesia if pap smear required in future (for abnormal bleeding, etc).    REVIEW OF SYSTEMS unable to obtain due to cognitive impairment, minimal verbal output  PHYSICAL EXAM: Vitals:   01/21/17 1408  BP: 112/82  Pulse: 83  SpO2: 93%   General: No acute distress, smiles, tries to follow simple commands, minimal verbal output (similar to prior) Head: Normocephalic/atraumatic Eyes: unable to perform fundoscopy Neck: supple, full range of motion Back: No paraspinal tenderness Heart: regular rate and rhythm Lungs: Clear to auscultation bilaterally. Vascular: No carotid bruits. Skin/Extremities: No rash, no edema Neurological Exam: Mental status: awake and alert, as above  Cranial  nerves: CN I: not tested CN II: pupils equal, round and reactive to light, +blink to threat bilaterally CN III, IV, VI: full range of motion, no nystagmus, no ptosis CN VII: upper and lower face symmetric CN XI: sternocleidomastoid and trapezius muscles intact CN XII: tongue midline Bulk & Tone: normal, no fasciculations. Motor: unable to do formal motor testing, but appears to have mild left-sided weakness, more notable on ambulation on left leg with left arm flexed (similar to prior) Sensation: intact to light touch Deep Tendon Reflexes: +2 throughout Plantar responses: downgoing bilaterally Cerebellar: no incoordination on reaching for objects Gait: slow and cautious, needs assistance, appears to favor left leg (similar to prior) Tremor: none  IMPRESSION: This is a 46 yo woman with severe cognitive impairment, mild left hemiparesis, and seizures since infancy suggestive of symptomatic generalized epilepsy. Her sister describes staring spells and GTCs, EEGs have shown diffuse slowing, frequent right hemisphere epileptiform discharges, as well as generalized discharges. A previous EEG was concerning for subclinical seizures, her last 1-hour EEG did not show any electrographic seizures, there were independent epileptiform discharges over the frontal regions, left greater than right. No seizures since 03/2016, no side effects on medications. Refills for Vimpat to  BID, liquid Keppra  BID and Depakote  BID sent today. She will follow-up in 6 months, family knows to call for any changes.   Thank you for allowing me to participate in her care.  Please do not hesitate to call for any questions or concerns.  The duration of this appointment visit was 15 minutes of face-to-face time with the patient.  Greater than 50% of this time was spent in counseling, explanation of diagnosis, planning of further management, and coordination of care.   Patrcia Dolly, M.D.   CC: Dr.  Armen Pickup

## 2017-01-24 ENCOUNTER — Other Ambulatory Visit: Payer: Self-pay | Admitting: Neurology

## 2017-01-24 DIAGNOSIS — G40319 Generalized idiopathic epilepsy and epileptic syndromes, intractable, without status epilepticus: Secondary | ICD-10-CM

## 2017-01-24 DIAGNOSIS — G40309 Generalized idiopathic epilepsy and epileptic syndromes, not intractable, without status epilepticus: Secondary | ICD-10-CM

## 2017-01-25 ENCOUNTER — Other Ambulatory Visit: Payer: Self-pay | Admitting: Neurology

## 2017-01-25 DIAGNOSIS — G40319 Generalized idiopathic epilepsy and epileptic syndromes, intractable, without status epilepticus: Secondary | ICD-10-CM

## 2017-01-25 MED ORDER — LEVETIRACETAM 100 MG/ML PO SOLN: ORAL | 11 refills | Status: DC

## 2017-01-25 MED ORDER — VALPROATE SODIUM 250 MG/5ML PO SOLN: ORAL | 11 refills | Status: DC

## 2017-02-25 ENCOUNTER — Telehealth: Payer: Self-pay | Admitting: Neurology

## 2017-02-25 NOTE — Telephone Encounter (Signed)
Patient called and wants to be seen Wednesday. I explained to her that Dr. Karel JarvisAquino is booked until May. She has an appointment for April and I put her on a wait list as well. She said her leg is swelling. Please Call. Thanks

## 2017-02-28 ENCOUNTER — Emergency Department (HOSPITAL_COMMUNITY): Payer: Medicaid Other

## 2017-02-28 ENCOUNTER — Emergency Department (HOSPITAL_COMMUNITY)
Admission: EM | Admit: 2017-02-28 | Discharge: 2017-02-28 | Disposition: A | Discharge status: Home / Self Care | Payer: Medicaid Other | Attending: Emergency Medicine | Admitting: Emergency Medicine

## 2017-02-28 ENCOUNTER — Encounter (HOSPITAL_COMMUNITY): Payer: Self-pay | Admitting: Emergency Medicine

## 2017-02-28 DIAGNOSIS — F17210 Nicotine dependence, cigarettes, uncomplicated: Secondary | ICD-10-CM | POA: Diagnosis not present

## 2017-02-28 DIAGNOSIS — Z79899 Other long term (current) drug therapy: Secondary | ICD-10-CM | POA: Insufficient documentation

## 2017-02-28 DIAGNOSIS — L03116 Cellulitis of left lower limb: Secondary | ICD-10-CM | POA: Insufficient documentation

## 2017-02-28 DIAGNOSIS — M71572 Other bursitis, not elsewhere classified, left ankle and foot: Secondary | ICD-10-CM | POA: Insufficient documentation

## 2017-02-28 DIAGNOSIS — M79605 Pain in left leg: Secondary | ICD-10-CM | POA: Diagnosis present

## 2017-02-28 DIAGNOSIS — M719 Bursopathy, unspecified: Secondary | ICD-10-CM

## 2017-02-28 LAB — COMPREHENSIVE METABOLIC PANEL
ALBUMIN: 3.7 g/dL (ref 3.5–5.0)
ALK PHOS: 97 U/L (ref 38–126)
ALT: 7 U/L — AB (ref 14–54)
AST: 16 U/L (ref 15–41)
Anion gap: 6 (ref 5–15)
BUN: <5 mg/dL — ABNORMAL LOW (ref 6–20)
CALCIUM: 9.8 mg/dL (ref 8.9–10.3)
CO2: 26 mmol/L (ref 22–32)
Chloride: 104 mmol/L (ref 101–111)
Creatinine, Ser: 0.51 mg/dL (ref 0.44–1.00)
GFR calc Af Amer: >60 mL/min (ref >60)
GFR calc non Af Amer: >60 mL/min (ref >60)
GLUCOSE: 100 mg/dL — AB (ref 65–99)
Potassium: 4.1 mmol/L (ref 3.5–5.1)
SODIUM: 136 mmol/L (ref 135–145)
Total Bilirubin: 0.2 mg/dL — ABNORMAL LOW (ref 0.3–1.2)
Total Protein: 7.7 g/dL (ref 6.5–8.1)

## 2017-02-28 LAB — CBC WITH DIFFERENTIAL/PLATELET
BASOS ABS: 0 10*3/uL (ref 0.0–0.1)
BASOS PCT: 0 %
EOS ABS: 0.1 10*3/uL (ref 0.0–0.7)
Eosinophils Relative: 1 %
HCT: 43 % (ref 36.0–46.0)
HEMOGLOBIN: 14.2 g/dL (ref 12.0–15.0)
LYMPHS ABS: 3.7 10*3/uL (ref 0.7–4.0)
Lymphocytes Relative: 29 %
MCH: 29.2 pg (ref 26.0–34.0)
MCHC: 33 g/dL (ref 30.0–36.0)
MCV: 88.3 fL (ref 78.0–100.0)
Monocytes Absolute: 1.1 10*3/uL — ABNORMAL HIGH (ref 0.1–1.0)
Monocytes Relative: 9 %
NEUTROS PCT: 61 %
Neutro Abs: 7.8 10*3/uL — ABNORMAL HIGH (ref 1.7–7.7)
Platelets: 385 10*3/uL (ref 150–400)
RBC: 4.87 MIL/uL (ref 3.87–5.11)
RDW: 13.6 % (ref 11.5–15.5)
WBC: 12.7 10*3/uL — AB (ref 4.0–10.5)

## 2017-02-28 LAB — C-REACTIVE PROTEIN: CRP: <0.8 mg/dL (ref <1.0)

## 2017-02-28 LAB — SEDIMENTATION RATE: Sed Rate: 18 mm/hr (ref 0–22)

## 2017-02-28 MED ORDER — LIDOCAINE-EPINEPHRINE (PF) 2 %-1:200000 IJ SOLN
20.0000 mL | Freq: Once | INTRAMUSCULAR | Status: AC
  Administered 2017-02-28: 20 mL
  Filled 2017-02-28: qty 20

## 2017-02-28 MED ORDER — CLINDAMYCIN HCL 300 MG PO CAPS
300.0000 mg | ORAL_CAPSULE | Freq: Once | ORAL | Status: AC
  Administered 2017-02-28: 300 mg via ORAL
  Filled 2017-02-28: qty 1

## 2017-02-28 MED ORDER — BACITRACIN ZINC 500 UNIT/GM EX OINT
TOPICAL_OINTMENT | Freq: Two times a day (BID) | CUTANEOUS | Status: DC
  Administered 2017-02-28: 1 via TOPICAL
  Filled 2017-02-28: qty 0.9

## 2017-02-28 MED ORDER — CLINDAMYCIN HCL 300 MG PO CAPS: 300.0000 mg | ORAL_CAPSULE | Freq: Four times a day (QID) | ORAL | 0 refills | Status: AC

## 2017-02-28 NOTE — ED Notes (Signed)
EDP at bedside  
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
## 2017-02-28 NOTE — ED Notes (Signed)
Pt ambulatory and independent at discharge.  Verbalized understanding of discharge instructions 

## 2017-02-28 NOTE — Discharge Instructions (Addendum)
Please take the antibiotics as prescribed. Call Dr. Roda ShuttersXU, Orthopedic doctor for an appointment next week.  Return to the emergency room if your symptoms get worse.

## 2017-02-28 NOTE — ED Notes (Signed)
Foot cleaned with bacitracin applied and rewrapped.
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
## 2017-03-12 ENCOUNTER — Ambulatory Visit (INDEPENDENT_AMBULATORY_CARE_PROVIDER_SITE_OTHER): Payer: Medicaid Other | Admitting: Orthopaedic Surgery

## 2017-03-12 ENCOUNTER — Encounter (INDEPENDENT_AMBULATORY_CARE_PROVIDER_SITE_OTHER): Payer: Self-pay | Admitting: Orthopaedic Surgery

## 2017-03-12 ENCOUNTER — Other Ambulatory Visit (INDEPENDENT_AMBULATORY_CARE_PROVIDER_SITE_OTHER): Payer: Self-pay

## 2017-03-12 VITALS — Ht <= 58 in | Wt 122.0 lb

## 2017-03-12 DIAGNOSIS — M25572 Pain in left ankle and joints of left foot: Secondary | ICD-10-CM | POA: Diagnosis not present

## 2017-03-12 MED ORDER — MUPIROCIN 2 % EX OINT: 1.0000 "application " | TOPICAL_OINTMENT | Freq: Two times a day (BID) | CUTANEOUS | 0 refills | Status: AC

## 2017-03-12 MED ORDER — DOXYCYCLINE HYCLATE 100 MG PO TABS: 100.0000 mg | ORAL_TABLET | Freq: Two times a day (BID) | ORAL | 0 refills | Status: DC

## 2017-03-12 NOTE — Progress Notes (Signed)
mri
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
## 2017-03-13 ENCOUNTER — Other Ambulatory Visit: Payer: Medicaid Other

## 2017-03-20 ENCOUNTER — Ambulatory Visit
Admission: RE | Admit: 2017-03-20 | Discharge: 2017-03-20 | Disposition: A | Discharge status: Home / Self Care | Payer: Medicaid Other | Source: Ambulatory Visit | Attending: Orthopaedic Surgery | Admitting: Orthopaedic Surgery

## 2017-03-20 DIAGNOSIS — M25572 Pain in left ankle and joints of left foot: Secondary | ICD-10-CM

## 2017-03-26 ENCOUNTER — Encounter (INDEPENDENT_AMBULATORY_CARE_PROVIDER_SITE_OTHER): Payer: Self-pay | Admitting: Orthopedic Surgery

## 2017-03-26 ENCOUNTER — Ambulatory Visit (INDEPENDENT_AMBULATORY_CARE_PROVIDER_SITE_OTHER): Payer: Medicaid Other | Admitting: Orthopedic Surgery

## 2017-03-26 VITALS — Ht <= 58 in | Wt 122.0 lb

## 2017-03-26 DIAGNOSIS — L97421 Non-pressure chronic ulcer of left heel and midfoot limited to breakdown of skin: Secondary | ICD-10-CM

## 2017-03-26 MED ORDER — DOXYCYCLINE HYCLATE 100 MG PO TABS: 100.0000 mg | ORAL_TABLET | Freq: Two times a day (BID) | ORAL | 0 refills | Status: AC

## 2017-03-26 NOTE — Progress Notes (Addendum)
Office Visit Note   Patient: Rachel GaskinsDamanta Valley           Date of Birth: 01/20/1971           MRN: [id number] Visit Date: 03/26/2017              Requested by: [name], [name], MD No address on file PCP: [name], [name], MD  Chief Complaint  Patient presents with  . Left Ankle - Open Wound    Achilles wound referral from Dr. Roda ShuttersXu      HPI: Patient is a 46 year old woman who is seen with family as well as an interpreter she is from [place].  Patient has had a chronic heel ulcer on the left over the Achilles for 6 weeks.  She has had a several ulcers over the years and 15 years ago had surgery to debride this ulcer.  Patient is currently been on clindamycin doxycycline and Bactroban.  Assessment & Plan: Visit Diagnoses:  1. 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  Refilled order for doxycycline continue [facility]ban [facility] discussed the importance of elevating her foot level with her heart.  Reevaluate in 2 weeks.  Discussed that we may need to consider surgical debridement.  Follow-Up Instructions: Return in about 2 weeks (around 04/09/2017).   Ortho Exam  Patient is alert, oriented, no adenopathy, well-dressed, normal affect, normal respiratory effort. Examination patient has a good dorsalis pedis pulse she has a fungating ulcer chronic over the left Achilles.  The center of the ulcer is about 5 mm in diameter with good granulation tissue around this is hypertrophic tissue from a chronic wound.  This area is approximately 5 cm in diameter.  There is no purulent drainage no ascending cellulitis she does have venous stasis swelling there is an odor.  Review of the MRI scan shows Achilles tendinopathy with longitudinal split tears.  There is also some peroneal retinaculum swelling but there are no fractures no bone lesions no signs of abscess or  osteomyelitis.  Imaging: No results found. No images are attached to the encounter.  Labs: Lab Results  Component Value Date   ESRSEDRATE 18 02/28/2017   ESRSEDRATE 1 12/02/2014   CRP <0.8 02/28/2017   LABURIC 3.0 12/02/2014   REPTSTATUS 06/10/2014 FINAL 06/09/2014   CULT NO GROWTH [facility]formed at [facility]  06/09/2014    @LABSALLVALUES (HGBA1)@  Body mass index is 25.5 kg/m.  Orders:  No orders of the defined types were placed in this encounter.  No orders of the defined types were placed in this encounter.    Procedures: No procedures performed  Clinical Data: No additional findings.  ROS:  All other systems negative, except as noted in the HPI. Review of Systems  Objective: Vital Signs: Ht 4\' 10"  (1.473 m)   Wt 122 lb (55.3 kg)   BMI 25.50 kg/m   Specialty Comments:  No specialty comments available.  PMFS History: Patient Active Problem List   Diagnosis Date Noted  . 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 Decreased appetite 01/31/2016  . Callus of foot 08/04/2015  . 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 Peripheral edema 11/09/2014  . Allergic rhinitis 08/16/2014  . Poor dentition 08/16/2014  . Cognitive impairment  06/08/2014  . Generalized seizure disorder (HCC) 06/08/2014   Past Medical History:  Diagnosis Date  . Seizures (HCC)    since baby    Family History  Problem Relation Age of Onset  . Diabetes Mother   . Hypertension Father     Past Surgical History:  Procedure Laterality Date  . no past surgery     Social History   Occupational History  . Occupation: Disabled  Tobacco Use  . Smoking status: Current Every Day Smoker    Types: Cigarettes  . Smokeless tobacco: Never Used  . Tobacco comment: 1-2 cigarettes per day.   Substance and Sexual Activity  . Alcohol use: No    Alcohol/week: 0.0 oz  . Drug use: No  . Sexual activity: No    Comment: Patient has never been sexually active and has no children.  Has never had pap smear, did not tolerate attempted pap smear in office and would likely need anesthesia if pap smear required in future.

## 2017-04-11 ENCOUNTER — Encounter (INDEPENDENT_AMBULATORY_CARE_PROVIDER_SITE_OTHER): Payer: Self-pay | Admitting: Orthopedic Surgery

## 2017-04-11 ENCOUNTER — Ambulatory Visit (INDEPENDENT_AMBULATORY_CARE_PROVIDER_SITE_OTHER): Payer: Medicaid Other | Admitting: Orthopedic Surgery

## 2017-04-11 DIAGNOSIS — L97421 Non-pressure chronic ulcer of left heel and midfoot limited to breakdown of skin: Secondary | ICD-10-CM | POA: Diagnosis not present

## 2017-04-11 MED ORDER — MUPIROCIN 2 % EX OINT: 1.0000 "application " | TOPICAL_OINTMENT | Freq: Two times a day (BID) | CUTANEOUS | 3 refills | Status: AC

## 2017-04-11 NOTE — Progress Notes (Signed)
Office Visit Note   Patient: Rachel Bush           Date of Birth: 06/19/1970           MRN: [id number] Visit Date: 04/11/2017              Requested by: [name], [name], MD No address on file PCP: [name], [name], MD  Chief Complaint  Patient presents with  . Left Foot - Wound Check      HPI: Patient seen in follow-up for decubitus ulcer over the left Achilles previously with exposed Achilles tendon.  By report has been using her PRAFO has completed her course of doxycycline and does not have Bactroban ointment remaining.  Assessment & Plan: Visit Diagnoses:  1. 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  Instructions were repeated to wash the foot with soap and water apply Bactroban to 4 x 4 gauze they were given a package of the 4 x 4 gauze there to apply this to the wound and re-use the Ace wrap.  She is to wear the PRAFO 24 hours a day.  Follow-Up Instructions: Return in about 4 weeks (around 05/09/2017).   Ortho Exam  Patient is alert, oriented, no adenopathy, well-dressed, normal affect, normal respiratory effort. Examination patient's wound is showing excellent improvement after debridement of fibrinous exudative tissue the wound bed is approximately 7 mm in diameter 1 mm deep with 100% beefy granulation tissue there is no cellulitis no odor no drainage no signs of infection.  Imaging: No results found. No images are attached to the encounter.  Labs: Lab Results  Component Value Date   ESRSEDRATE 18 02/28/2017   ESRSEDRATE 1 12/02/2014   CRP <0.8 02/28/2017   LABURIC 3.0 12/02/2014   REPTSTATUS 06/10/2014 FINAL 06/09/2014   CULT NO GROWTH Performed at [facility]  06/09/2014    @LABSALLVALUES (HGBA1)@  There is no height or weight on file to calculate BMI.  Orders:  No orders of the defined types were placed in this encounter.  Meds  ordered this encounter  Medications  . mupirocin ointment (BACTROBAN) 2 %    Sig: Apply 1 application topically 2 (two) times daily. Apply to the affected area 2 times a day    Dispense:  22 g    Refill:  3     Procedures: No procedures performed  Clinical Data: No additional findings.  ROS:  All other systems negative, except as noted in the HPI. Review of Systems  Objective: Vital Signs: There were no vitals taken for this visit.  Specialty Comments:  No specialty comments available.  PMFS History: Patient Active Problem List   Diagnosis Date Noted  . 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 Decreased appetite 01/31/2016  . Callus of foot 08/04/2015  . 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 Peripheral edema 11/09/2014  . Allergic rhinitis 08/16/2014  . Poor dentition 08/16/2014  . Cognitive impairment 06/08/2014  . Generalized seizure disorder (HCC) 06/08/2014   Past Medical History:  Diagnosis Date  . Seizures (HCC)    since baby    Family History  Problem Relation Age of Onset  . Diabetes Mother   . Hypertension Father     Past Surgical History:  Procedure Laterality Date  .  no past surgery     Social History   Occupational History  . Occupation: Disabled  Tobacco Use  . Smoking status: Current Every Day Smoker    Types: Cigarettes  . Smokeless tobacco: Never Used  . Tobacco comment: 1-2 cigarettes per day.  Substance and Sexual Activity  . Alcohol use: No    Alcohol/week: 0.0 oz  . Drug use: No  . Sexual activity: No    Comment: Patient has never been sexually active and has no children.  Has never had pap smear, did not tolerate attempted pap smear in office and would likely need anesthesia if pap smear required in future.

## 2017-05-14 ENCOUNTER — Ambulatory Visit (INDEPENDENT_AMBULATORY_CARE_PROVIDER_SITE_OTHER): Payer: Medicaid Other | Admitting: Orthopedic Surgery

## 2017-05-28 ENCOUNTER — Ambulatory Visit (INDEPENDENT_AMBULATORY_CARE_PROVIDER_SITE_OTHER): Payer: Medicaid Other | Admitting: Orthopedic Surgery

## 2017-07-22 ENCOUNTER — Ambulatory Visit: Payer: Medicaid Other | Admitting: Neurology

## 2017-07-31 ENCOUNTER — Telehealth (INDEPENDENT_AMBULATORY_CARE_PROVIDER_SITE_OTHER): Payer: Self-pay | Admitting: Orthopedic Surgery

## 2017-07-31 NOTE — Telephone Encounter (Signed)
Please open a 10:30 am appt on [facility] schedule for this patient. Patient has swollen leg and in severe pain.

## 2017-08-01 ENCOUNTER — Ambulatory Visit (INDEPENDENT_AMBULATORY_CARE_PROVIDER_SITE_OTHER): Payer: Medicaid Other | Admitting: Orthopedic Surgery

## 2017-08-01 ENCOUNTER — Encounter (INDEPENDENT_AMBULATORY_CARE_PROVIDER_SITE_OTHER): Payer: Self-pay | Admitting: Orthopedic Surgery

## 2017-08-01 VITALS — Ht <= 58 in | Wt 122.0 lb

## 2017-08-01 DIAGNOSIS — L97421 Non-pressure chronic ulcer of left heel and midfoot limited to breakdown of skin: Secondary | ICD-10-CM

## 2017-08-01 NOTE — Progress Notes (Signed)
Office Visit Note   Patient: Rachel Bush           Date of Birth: 07/23/1970           MRN: [id number] Visit Date: 08/01/2017              Requested by: [name], [name], MD No address on file PCP: [name], [name], MD  Chief Complaint  Patient presents with  . Left Ankle - Follow-up    Achilles ulcer      HPI: Is a 47 year old woman who is seen in follow-up for left Achilles tendon ulcer.  Patient was initially seen 3 months ago.  She was given a PRAFO to protect the ulcer she did not wear this she was given a prescription for doxycycline and Bactroban and it is not clear whether she used either of these.  Patient was seen with family present.  Assessment & Plan: Visit Diagnoses:  1. 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  The importance of using a pillow under the calf to float the heel was discussed.  Patient was given Band-Aids.  Follow-Up Instructions: Return in about 1 month (around 08/29/2017).   Ortho Exam  Patient is alert, oriented, no adenopathy, well-dressed, normal affect, normal respiratory effort. Examination patient has a good dorsalis pedis pulse.  After informed consent the ulcerative tissue was debrided of skin and soft tissue back to healthy viable granulation tissue.  This area is 10 mm in diameter 1 mm deep there is no exposed bone or tendon no cellulitis no tenderness to palpation.  Imaging: No results found. No images are attached to the encounter.  Labs: Lab Results  Component Value Date   ESRSEDRATE 18 02/28/2017   ESRSEDRATE 1 12/02/2014   CRP <0.8 02/28/2017   LABURIC 3.0 12/02/2014   REPTSTATUS 06/10/2014 FINAL 06/09/2014   CULT NO GROWTH Performed at [facility]  06/09/2014    @LABSALLVALUES (HGBA1)@  Body mass index is 25.5 kg/m.  Orders:  No orders of the  defined types were placed in this encounter.  No orders of the defined types were placed in this encounter.    Procedures: No procedures performed  Clinical Data: No additional findings.  ROS:  All other systems negative, except as noted in the HPI. Review of Systems  Objective: Vital Signs: Ht 4\' 10"  (1.473 m)   Wt 122 lb (55.3 kg)   BMI 25.50 kg/m   Specialty Comments:  No specialty comments available.  PMFS History: Patient Active Problem List   Diagnosis Date Noted  . 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 Decreased appetite 01/31/2016  . Callus of foot 08/04/2015  . 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 Peripheral edema 11/09/2014  . Allergic rhinitis 08/16/2014  . Poor dentition 08/16/2014  . Cognitive impairment 06/08/2014  . Generalized seizure disorder (HCC) 06/08/2014   Past Medical History:  Diagnosis Date  . Seizures (HCC)    since baby    Family History  Problem Relation Age of Onset  . Diabetes Mother   . Hypertension Father     Past Surgical History:  Procedure Laterality Date  . no past surgery     Social History   Occupational  History  . Occupation: Disabled  Tobacco Use  . Smoking status: Current Every Day Smoker    Types: Cigarettes  . Smokeless tobacco: Never Used  . Tobacco comment: 1-2 cigarettes per day.  Substance and Sexual Activity  . Alcohol use: No    Alcohol/week: 0.0 oz  . Drug use: No  . Sexual activity: Never    Comment: Patient has never been sexually active and has no children.  Has never had pap smear, did not tolerate attempted pap smear in office and would likely need anesthesia if pap smear required in future.

## 2017-08-07 ENCOUNTER — Other Ambulatory Visit: Payer: Self-pay

## 2017-08-07 ENCOUNTER — Encounter: Payer: Self-pay | Admitting: Neurology

## 2017-08-07 ENCOUNTER — Ambulatory Visit (INDEPENDENT_AMBULATORY_CARE_PROVIDER_SITE_OTHER): Payer: Medicaid Other | Admitting: Neurology

## 2017-08-07 VITALS — BP 100/64 | HR 82 | Ht <= 58 in | Wt 120.0 lb

## 2017-08-07 DIAGNOSIS — G40219 Localization-related (focal) (partial) symptomatic epilepsy and epileptic syndromes with complex partial seizures, intractable, without status epilepticus: Secondary | ICD-10-CM

## 2017-08-07 MED ORDER — LACOSAMIDE 100 MG PO TABS: ORAL_TABLET | ORAL | 5 refills | Status: DC

## 2017-08-07 MED ORDER — VALPROATE SODIUM 250 MG/5ML PO SOLN: ORAL | 11 refills | Status: DC

## 2017-08-07 MED ORDER — LEVETIRACETAM 100 MG/ML PO SOLN: ORAL | 11 refills | Status: DC

## 2017-08-07 NOTE — Patient Instructions (Signed)
1. Continue all your medications 2. If seizures increase, call our office and we will adjust medication 3. Follow-up in 6 months, call for any changes  Seizure Precautions: 1. If medication has been prescribed for you to prevent seizures, take it exactly as directed.  Do not stop taking the medicine without talking to your doctor first, even if you have not had a seizure in a long time.   2. Avoid activities in which a seizure would cause danger to yourself or to others.  Don't operate dangerous machinery, swim alone, or climb in high or dangerous places, such as on ladders, roofs, or girders.  Do not drive unless your doctor says you may.  3. If you have any warning that you may have a seizure, lay down in a safe place where you can't hurt yourself.    4.  No driving for 6 months from last seizure, as per [facility].   Please refer to the following link on the [employer]'s website for more information: http://www.epilepsyfoundation.org/answerplace/Social/driving/drivingu.cfm   5.  Maintain good sleep hygiene. Avoid alcohol.  6.  Notify your neurology if you are planning pregnancy or if you become pregnant.  7.  Contact your doctor if you have any problems that may be related to the medicine you are taking.  8.  Call 911 and bring the patient back to the ED if:        A.  The seizure lasts longer than 5 minutes.       B.  The patient doesn't awaken shortly after the seizure  C.  The patient has new problems such as difficulty seeing, speaking or moving  D.  The patient was injured during the seizure  E.  The patient has a temperature over 102 F (39C)  F.  The patient vomited and now is having trouble breathing         A

## 2017-08-07 NOTE — Progress Notes (Signed)
NEUROLOGY FOLLOW UP OFFICE NOTE  Rachel Bush [id number]  DOB: Mar 21, 1971  HISTORY OF PRESENT ILLNESS: I had the pleasure of seeing Rachel Bush in follow-up in the neurology clinic on 08/07/2017.  The patient was last seen 6 months ago for symptomatic generalized epilepsy. She is again accompanied by her nephew who helps supplement the history today. A [place] medical translator helps with translation, her nephew speaks [place] as well. Her nephew reports that since her last visit, she has had 2 or 3, last seizure was a month ago, he attributes this to not eating. He has difficulty describing the seizure, saying she made a sound. Family administers her Vimpat  BID, Keppra  BID, and Depakote  BID with no side effects. She speaks a few words in [place] and follows simple instructions. No falls. She has been seeing Ortho for a left ankle pressure ulcer.  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 Her sister provides the history due to patient's cognitive status. She reports that seizures started around a week after birth. As far as her sister knows, she had a normal delivery. There is no family history of seizures. She describes seizures starting with staring and unresponsiveness, her eyes look like they are moving around, then both hands curl up and she becomes stiff with shaking. She would "vocalize like a goat." She would be very tired after a convulsion. She had been living in [place] where she apparently was having more seizures and side effects to unrecalled medications, but since moving here and adjusting medications, seizure control has been better. In the past, she would have seizures 2-3 times a week, longest seizure-free interval has been 2 months.   Records on EPIC were reviewed. She was admitted to [facility] last March 2016 in status  epilepticus, per ER notes family reported she was having seizures all night and had 3 seizures witnessed by EMS. She was reported to be taking Depakote and Keppra, however Depakote level was <10. Keppra level was 71 (ref 10-40). She was continued on Keppra and Depakote, with Depakote load given, and started on Vimpat. I personally reviewed head CT which did not show any acute changes. There was cerebellar atrophy seen and small calcifications in the left frontal region. EEG showed diffuse slowing, with frequent sharp waves over the right hemisphere with phase reversals primarily in the frontal and parietal regions. Occasional generalized sharp wave discharges were seen. She had a clinical seizure with EEG showing initially an increase in focal as well as generalized sharp wave discharges followed by fairly abrupt onset of diffuse rhythmic 12 Hz activity which rapidly became almost obscured by motor activity. The rhythmic fast activity rapidly gave way to rhythmic diffuse slow-wave activity for several seconds, then return to baseline. She continued to improve, however was noted to have poor appetite and nausea, Vimpat was held and Keppra and Depakote maximized. She was discharged home on Keppra  BID and Depakote  BID. She was seen by her PCP in June, with report of 10 GTCs in 6 weeks. Depakote increased to  BID, however Depakote level was 134.2 and dose reduced back to  BID. Repeat trough levels in August showed a Depakote level of 114, Keppra level 39.2. She had an EEG at [facility] which reported Dysrhthmia grade 3 generalized, "slow wave seizures," with disorganized background activity of 5-7 Hz. As the record progresses, intermittent  episodes of delta frequency slowing in a generalized fashion are seen intermixed with sharp and slow wave complexes that emanate primarily from the right temporal region. Between episodes of delta slowing, a lower amplitude theta frequency baseline slowing is seen.  There was concern that the episodes of generalized higher amplitude delta intermixed with sharp and slow wave complexes may represent "slow wave seizures" suggesting subclinical seizures. Vimpat was added back, and increased to  BID last October. Her family denies any GTCs since 01/27/15. She is tolerating addition of Vimpat better this time. Family also reports staring episodes occurring 1-2 times a week, last episode was 3 days ago.   Her sister reports that at baseline, she can speak but in "slow motion" that only her sister can understand. She can sometimes answer her sister immediately, other times there is delay. She is dependent on all ADLs, including eating. She can walk short distances but needs her sister to assist her.   Epilepsy Risk Factors: Severe cognitive impairment with left-sided hemiparesis. Per sister, she had a normal delivery but had developmental delay. As far as her sister knows, there is no history of febrile convulsions, CNS infections such as meningitis/encephalitis, significant traumatic brain injury, neurosurgical procedures, or family history of seizures.  Prior AEDs: Phenobarbital  Diagnostic Data: Her 1-hour EEG was abnormal due to mild diffuse slowing, and occasional epileptiform discharges seen independently over the bilateral frontal regions, left greater than right. Previous EEG reported "slow wave seizures" suggesting subclinical seizures, primarily from the right temporal region.   PAST MEDICAL HISTORY: Past Medical History:  Diagnosis Date  . Seizures (HCC)    since baby    MEDICATIONS: Current Outpatient Medications on File Prior to Visit  Medication Sig Dispense Refill  . clindamycin (CLEOCIN) 300 MG capsule Take 1 capsule (300 mg total) by mouth 4 (four) times daily. 28 capsule 0  . doxycycline (VIBRA-TABS) 100 MG tablet Take 1 tablet (100 mg total) by mouth 2 (two) times daily. 20 tablet 0  . Elastic Bandages & Supports (MEDICAL COMPRESSION  STOCKINGS) MISC 1 each by Does not apply route daily. 30 mm Hg 2 each 0  . levETIRAcetam (KEPPRA) 100 MG/ML solution TAKE BY MOUTH TWICE DAILY 900 mL 11  . Multiple Vitamins-Calcium (ONE-A-DAY WOMENS FORMULA) TABS Take 1 tablet by mouth daily.  0  . mupirocin ointment (BACTROBAN) 2 % Apply 1 application topically 2 (two) times daily. 22 g 0  . mupirocin ointment (BACTROBAN) 2 % Apply 1 application topically 2 (two) times daily. Apply to the affected area 2 times a day 22 g 3  . Valproate Sodium (DEPAKENE) 250 MG/5ML SOLN solution Take 10mL (  total) by mouth twice a day 600 mL 11  . VIMPAT 100 MG TABS TAKE 1 1/2 TABLET BY MOUTH TWICE DAILY. 90 tablet 0   No current facility-administered medications on file prior to visit.     ALLERGIES: No Known Allergies  FAMILY HISTORY: Family History  Problem Relation Age of Onset  . Diabetes Mother   . Hypertension Father     SOCIAL HISTORY: Social History   Socioeconomic History  . Marital status: Single    Spouse name: Not on file  . Number of children: 0  . Years of education: None  . Highest education level: Not on file  Occupational History  . Occupation: Disabled  Social Needs  . Financial resource strain: Not on file  . Food insecurity:    Worry: Not on file    Inability: Not  on file  . Transportation needs:    Medical: Not on file    Non-medical: Not on file  Tobacco Use  . Smoking status: Current Every Day Smoker    Types: Cigarettes  . Smokeless tobacco: Never Used  . Tobacco comment: 1-2 cigarettes per day.  Substance and Sexual Activity  . Alcohol use: No    Alcohol/week: 0.0 oz  . Drug use: No  . Sexual activity: Never    Comment: Patient has never been sexually active and has no children.  Has never had pap smear, did not tolerate attempted pap smear in office and would likely need anesthesia if pap smear required in future.   Lifestyle  . Physical activity:    Days per week: Not on file    Minutes per  session: Not on file  . Stress: Not on file  Relationships  . Social connections:    Talks on phone: Not on file    Gets together: Not on file    Attends religious service: Not on file    Active member of club or organization: Not on file    Attends meetings of clubs or organizations: Not on file    Relationship status: Not on file  . Intimate partner violence:    Fear of current or ex partner: Not on file    Emotionally abused: Not on file    Physically abused: Not on file    Forced sexual activity: Not on file  Other Topics Concern  . Not on file  Social History Narrative   Lives at home with mother and sister.   Right-handed.   One cup caffeine daily.      Per sister:   Patient has never been sexually active and has no children.  Has never had pap smear, did not tolerate attempted pap smear in office and would likely need anesthesia if pap smear required in future (for abnormal bleeding, etc).    REVIEW OF SYSTEMS unable to obtain due to cognitive impairment, minimal verbal output  PHYSICAL EXAM: Vitals:   08/07/17 1321  BP: 100/64  Pulse: 82  SpO2: 98%   General: No acute distress, smiles, tries to follow simple commands, speaks words in [place] Head: Normocephalic/atraumatic Neck: supple, full range of motion Back: No paraspinal tenderness Heart: regular rate and rhythm Lungs: Clear to auscultation bilaterally. Vascular: No carotid bruits. Skin/Extremities: No rash. Boggy left ankle, ?tenderness Neurological Exam: Mental status: awake and alert, as above  Cranial nerves: CN I: not tested CN II: pupils equal, round and reactive to light, +blink to threat bilaterally CN III, IV, VI: full range of motion, no nystagmus, no ptosis CN VII: upper and lower face symmetric Bulk & Tone: normal, no fasciculations. Motor: unable to do formal motor testing, but appears to have mild left-sided weakness, more notable on ambulation on left leg with left arm flexed (similar  to prior) Sensation: intact to light touch Deep Tendon Reflexes: +2 throughout Plantar responses: downgoing bilaterally Cerebellar: no incoordination on reaching for objects Gait: slow and cautious, needs assistance, appears to favor left leg (similar to prior) Tremor: none  IMPRESSION: This is a 46 yo woman with severe cognitive impairment, mild left hemiparesis, and seizures since infancy suggestive of symptomatic generalized epilepsy. Her sister described staring spells and GTCs, EEGs have shown diffuse slowing, frequent right hemisphere epileptiform discharges, as well as generalized discharges. A previous EEG was concerning for subclinical seizures, her last 1-hour EEG did not show any electrographic seizures, there were  independent epileptiform discharges over the frontal regions, left greater than right. Her family had previously been reporting her doing well since 03/2016, however today states she had 2-3 seizures in the past 6 months. Her nephew states this is much less than in their home country and would like to hold off on medication increase for now. Refills for Vimpat to  BID, liquid Keppra  BID and Depakote  BID sent today. She will follow-up in 6 months, family knows to call for any changes.   Thank you for allowing me to participate in her care.  Please do not hesitate to call for any questions or concerns.  The duration of this appointment visit was 15 minutes of face-to-face time with the patient.  Greater than 50% of this time was spent in counseling, explanation of diagnosis, planning of further management, and coordination of care.   Patrcia Dolly, M.D.   CC: Dr. Armen Pickup
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
## 2017-09-24 ENCOUNTER — Other Ambulatory Visit: Payer: Self-pay | Admitting: Neurology

## 2017-09-24 DIAGNOSIS — G40219 Localization-related (focal) (partial) symptomatic epilepsy and epileptic syndromes with complex partial seizures, intractable, without status epilepticus: Secondary | ICD-10-CM

## 2017-09-30 ENCOUNTER — Other Ambulatory Visit: Payer: Self-pay | Admitting: Neurology

## 2017-09-30 DIAGNOSIS — G40219 Localization-related (focal) (partial) symptomatic epilepsy and epileptic syndromes with complex partial seizures, intractable, without status epilepticus: Secondary | ICD-10-CM

## 2017-09-30 MED ORDER — LACOSAMIDE 100 MG PO TABS: ORAL_TABLET | ORAL | 5 refills | Status: AC

## 2017-09-30 MED ORDER — LEVETIRACETAM 100 MG/ML PO SOLN: ORAL | 11 refills | Status: AC

## 2017-09-30 MED ORDER — VALPROATE SODIUM 250 MG/5ML PO SOLN: ORAL | 11 refills | Status: AC

## 2017-10-28 ENCOUNTER — Telehealth: Payer: Self-pay | Admitting: Neurology

## 2017-10-28 NOTE — Telephone Encounter (Signed)
Patients nephew denies that patient is having more seizures.  He just wanted Dr Karel JarvisAquino to fill out paperwork for him.  Tried to clarify but nephew was hard to understand and he just wanted to speak to Dr Karel JarvisAquino.  Advised him to bring paperwork by and we could evaluate it.  He got flustered and hung up.

## 2017-10-28 NOTE — Telephone Encounter (Signed)
Patient's nephew called to say that she is having a lot of Seizures. Please Call. Thanks

## 2017-10-31 ENCOUNTER — Telehealth: Payer: Self-pay | Admitting: Neurology

## 2017-10-31 NOTE — Telephone Encounter (Signed)
Spoke with Rosan regarding patient.  He is needing medical records.  Advised him to come to office and have patient sign release.  He also said he thinks patient broke her nose.  Advised him to take her to the hospital or contact PCP for evaluation.  He then hungup.
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
## 2018-02-28 ENCOUNTER — Ambulatory Visit: Payer: Medicaid Other | Admitting: Neurology

## 2018-10-25 IMAGING — CR DG FOOT COMPLETE 3+V*L*
3 series · 3 of 3 positions shown · non-contrast
Comparison: None.

CLINICAL DATA: Pain

EXAM:
LEFT FOOT - COMPLETE 3+ VIEW

[x foot lat left]
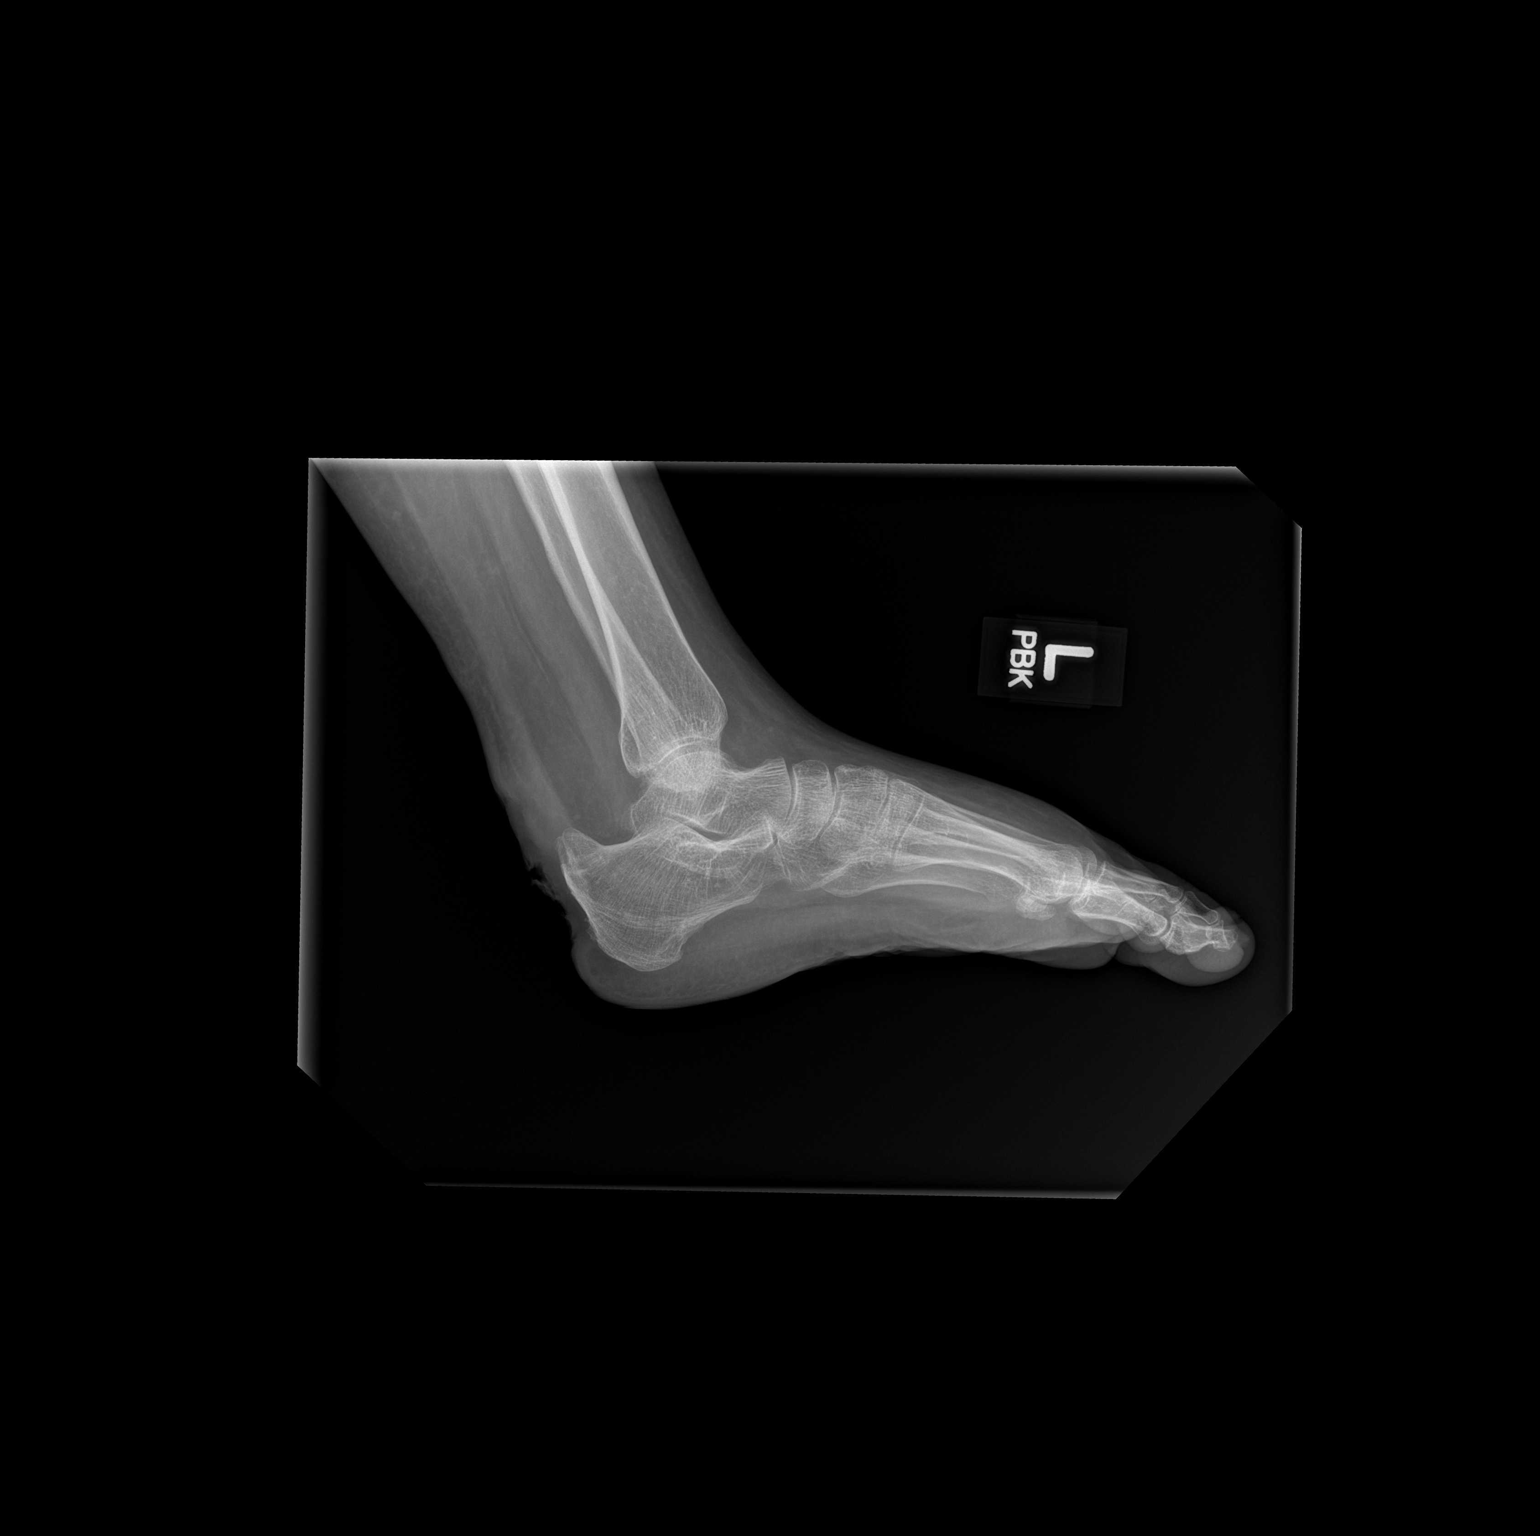

[x foot ap left]
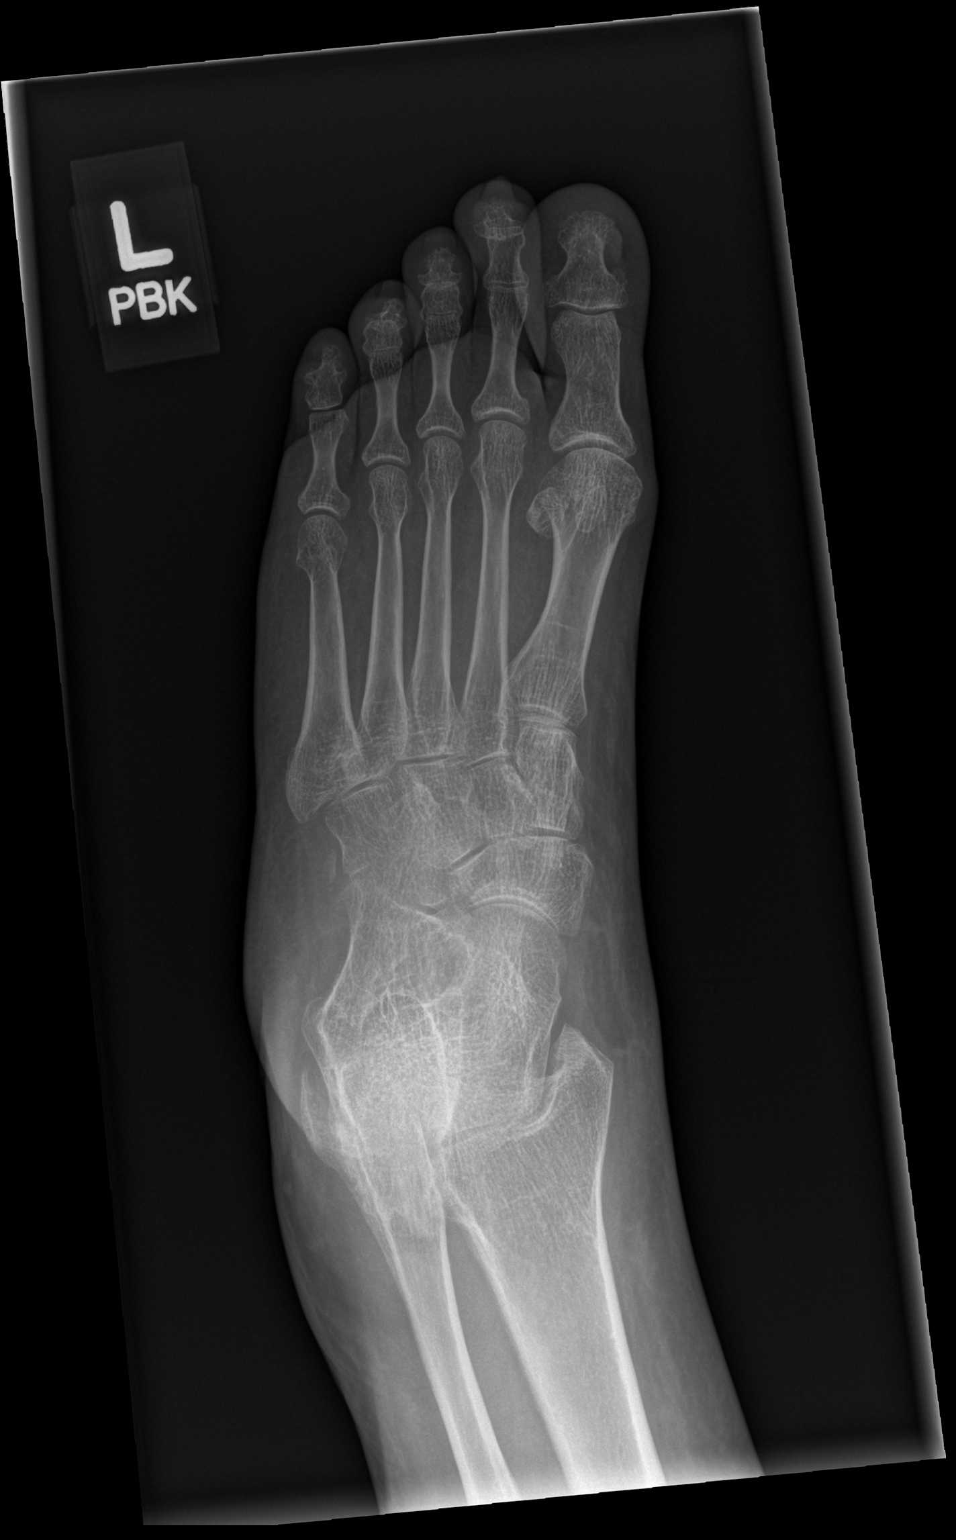

[x foot obl left]
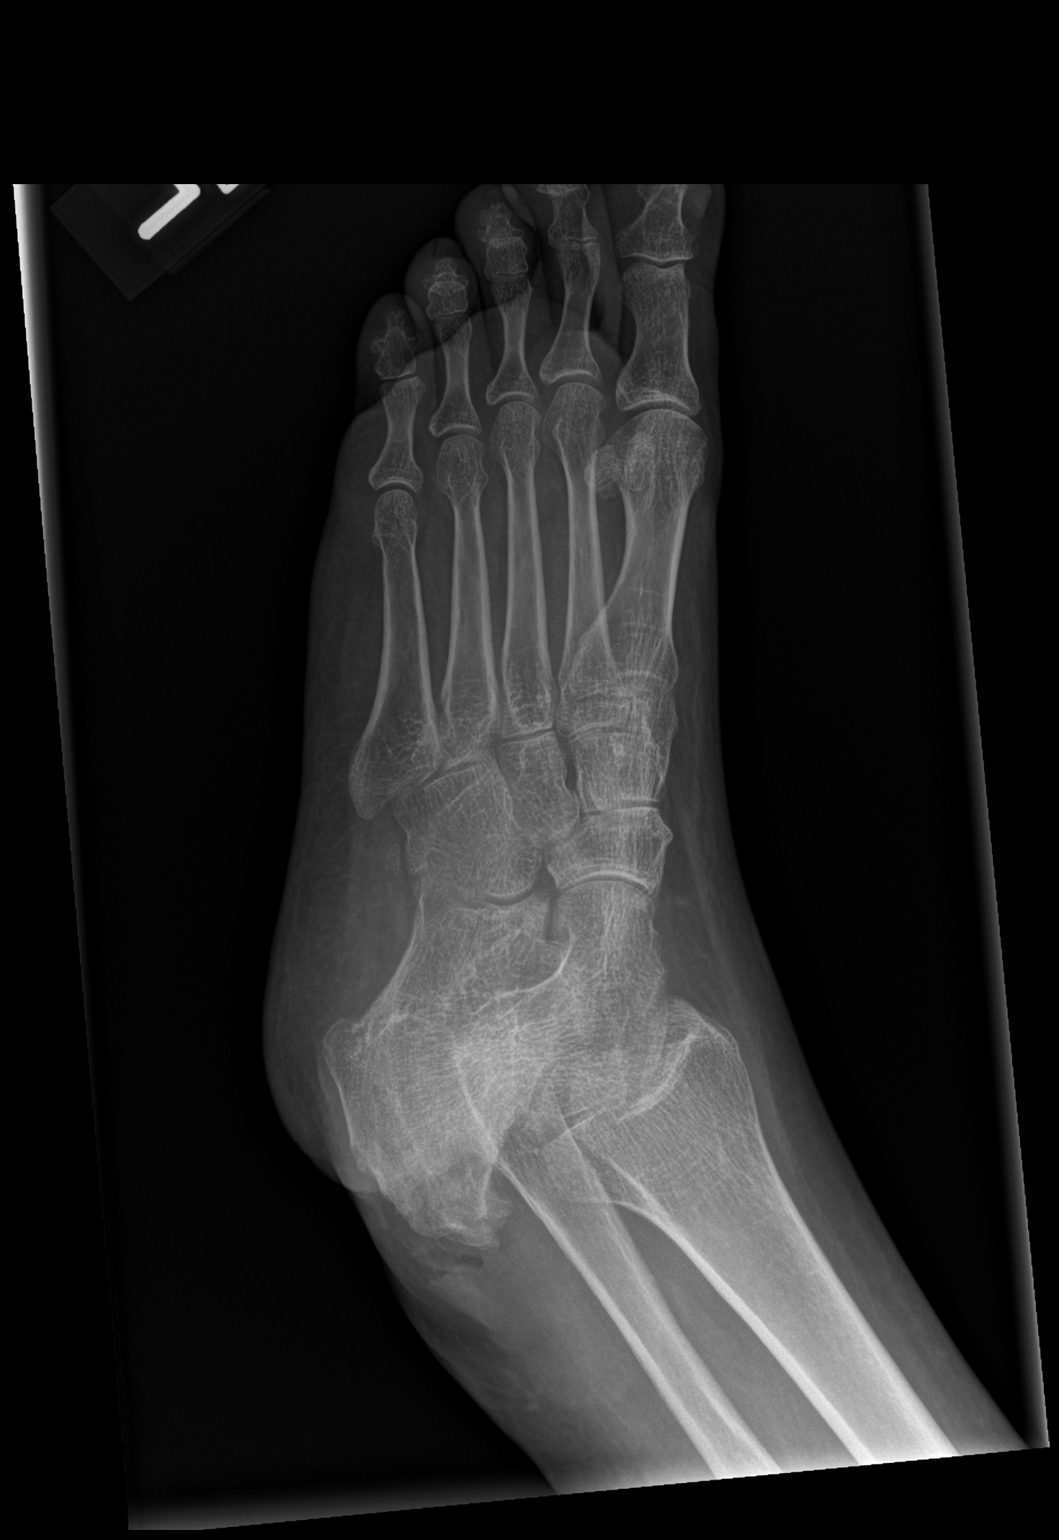

[3 of 3 positions shown; findings below may reference images not displayed]

FINDINGS: Frontal, oblique, and lateral views were obtained. There is no acute
fracture or dislocation. There is remodeling along the posterior
calcaneus, likely residua of prior trauma. Joint spaces appear
unremarkable. No erosive change. There is mild generalized soft
tissue swelling. Bones appear somewhat osteoporotic.
IMPRESSION: Question old trauma with remodeling of the posterior calcaneus. No
acute fracture or dislocation. No appreciable arthropathic change.
Bones appear somewhat osteoporotic. There is mild generalized soft
tissue swelling.
